# Patient Record
Sex: Female | Born: 1975 | Race: Black or African American | Hispanic: No | Marital: Married | Smoking: Never smoker
Health system: Southern US, Community
[De-identification: ages and names within clinical notes are randomized; demographics above are authoritative.]

## PROBLEM LIST (undated history)

## (undated) DIAGNOSIS — Z21 Asymptomatic human immunodeficiency virus [HIV] infection status: Secondary | ICD-10-CM

## (undated) DIAGNOSIS — E669 Obesity, unspecified: Secondary | ICD-10-CM

## (undated) DIAGNOSIS — B20 Human immunodeficiency virus [HIV] disease: Secondary | ICD-10-CM

## (undated) HISTORY — PX: TOOTH EXTRACTION: SUR596

## (undated) HISTORY — PX: ABDOMINAL HYSTERECTOMY: SHX81

---

## 2010-06-18 ENCOUNTER — Other Ambulatory Visit (HOSPITAL_COMMUNITY)
Admission: RE | Admit: 2010-06-18 | Discharge: 2010-06-18 | Disposition: A | Discharge status: Home / Self Care | Payer: Self-pay | Source: Ambulatory Visit | Attending: Obstetrics and Gynecology | Admitting: Obstetrics and Gynecology

## 2010-06-18 DIAGNOSIS — Z01419 Encounter for gynecological examination (general) (routine) without abnormal findings: Secondary | ICD-10-CM | POA: Insufficient documentation

## 2010-06-18 DIAGNOSIS — Z113 Encounter for screening for infections with a predominantly sexual mode of transmission: Secondary | ICD-10-CM | POA: Insufficient documentation

## 2010-12-10 ENCOUNTER — Emergency Department (HOSPITAL_BASED_OUTPATIENT_CLINIC_OR_DEPARTMENT_OTHER)
Admission: EM | Admit: 2010-12-10 | Discharge: 2010-12-10 | Disposition: A | Discharge status: Home / Self Care | Payer: Self-pay | Attending: Emergency Medicine | Admitting: Emergency Medicine

## 2010-12-10 ENCOUNTER — Encounter: Payer: Self-pay | Admitting: *Deleted

## 2010-12-10 ENCOUNTER — Emergency Department (INDEPENDENT_AMBULATORY_CARE_PROVIDER_SITE_OTHER): Payer: Self-pay

## 2010-12-10 ENCOUNTER — Telehealth (HOSPITAL_BASED_OUTPATIENT_CLINIC_OR_DEPARTMENT_OTHER): Payer: Self-pay | Admitting: Emergency Medicine

## 2010-12-10 DIAGNOSIS — O2390 Unspecified genitourinary tract infection in pregnancy, unspecified trimester: Secondary | ICD-10-CM | POA: Insufficient documentation

## 2010-12-10 DIAGNOSIS — N390 Urinary tract infection, site not specified: Secondary | ICD-10-CM | POA: Insufficient documentation

## 2010-12-10 DIAGNOSIS — O2692 Pregnancy related conditions, unspecified, second trimester: Secondary | ICD-10-CM

## 2010-12-10 DIAGNOSIS — R109 Unspecified abdominal pain: Secondary | ICD-10-CM

## 2010-12-10 DIAGNOSIS — Z21 Asymptomatic human immunodeficiency virus [HIV] infection status: Secondary | ICD-10-CM | POA: Insufficient documentation

## 2010-12-10 HISTORY — DX: Human immunodeficiency virus (HIV) disease: B20

## 2010-12-10 HISTORY — DX: Asymptomatic human immunodeficiency virus (hiv) infection status: Z21

## 2010-12-10 LAB — CBC
Hemoglobin: 12.2 g/dL (ref 12.0–15.0)
Platelets: 174 10*3/uL (ref 150–400)
RBC: 3.98 MIL/uL (ref 3.87–5.11)
WBC: 8.5 10*3/uL (ref 4.0–10.5)

## 2010-12-10 LAB — BASIC METABOLIC PANEL
GFR calc Af Amer: >90 mL/min (ref >90)
GFR calc non Af Amer: >90 mL/min (ref >90)
Glucose, Bld: 103 mg/dL — ABNORMAL HIGH (ref 70–99)
Potassium: 3.6 mEq/L (ref 3.5–5.1)
Sodium: 135 mEq/L (ref 135–145)

## 2010-12-10 LAB — URINALYSIS, ROUTINE W REFLEX MICROSCOPIC
Bilirubin Urine: NEGATIVE
Nitrite: NEGATIVE
Specific Gravity, Urine: 1.025 (ref 1.005–1.030)
pH: 5.5 (ref 5.0–8.0)

## 2010-12-10 LAB — HCG, QUANTITATIVE, PREGNANCY: hCG, Beta Chain, Quant, S: 18709 m[IU]/mL — ABNORMAL HIGH (ref <5)

## 2010-12-10 LAB — PREGNANCY, URINE: Preg Test, Ur: POSITIVE

## 2010-12-10 LAB — URINE MICROSCOPIC-ADD ON

## 2010-12-10 MED ORDER — PRENATAL RX 60-1 MG PO TABS: 1.0000 | ORAL_TABLET | Freq: Every day | ORAL | Status: DC

## 2010-12-10 MED ORDER — CEPHALEXIN 500 MG PO CAPS: 500.0000 mg | ORAL_CAPSULE | Freq: Four times a day (QID) | ORAL | Status: AC

## 2010-12-10 NOTE — ED Provider Notes (Signed)
History     CSN: [id number] Arrival date & time: 12/10/2010  9:22 AM   First MD Initiated Contact with Patient 12/10/10 0932      Chief Complaint  Patient presents with  . Abdominal Pain    (Consider location/radiation/quality/duration/timing/severity/associated sxs/prior treatment) Patient is a 35 y.o. female presenting with abdominal pain. The history is provided by the patient.  Abdominal Pain The primary symptoms of the illness include abdominal pain. The primary symptoms of the illness do not include shortness of breath, nausea, vomiting, diarrhea, hematemesis, hematochezia, dysuria, vaginal discharge or vaginal bleeding. The current episode started more than 2 days ago (Onset of right lower corner abdominal pain starting one month ago but worse in the past 2-3 days). The onset of the illness was gradual. The problem has been gradually worsening.  The abdominal pain is located in the RLQ. The abdominal pain does not radiate. The severity of the abdominal pain is 6/10. The abdominal pain is relieved by nothing.  The patient states that she believes she is currently pregnant (Patient's last menstrual period was June 28. She has not had a confirmatory pregnancy test 4 followup.). The patient has not had a change in bowel habit. Symptoms associated with the illness do not include chills, anorexia, diaphoresis, heartburn, constipation, urgency, hematuria, frequency or back pain.   Patient is a gravida 8 para 2 last measured. A June 28 she has not had a pregnancy test or any OB/GYN followup. Patient does believe that she is pregnant. She has had multiple miscarriages in the past.   Past Medical History  Diagnosis Date  . HIV (human immunodeficiency virus infection)     History reviewed. No pertinent past surgical history.  No family history on file.  History  Substance Use Topics  . Smoking status: Former Games developer  . Smokeless tobacco: Not on file  . Alcohol Use: No    OB History      Grav Para Term Preterm Abortions TAB SAB Ect Mult Living                  Review of Systems  Constitutional: Negative for chills and diaphoresis.  HENT: Negative for congestion and neck pain.   Eyes: Negative for redness and visual disturbance.  Respiratory: Negative for cough and shortness of breath.   Cardiovascular: Negative for chest pain.  Gastrointestinal: Positive for abdominal pain. Negative for heartburn, nausea, vomiting, diarrhea, constipation, hematochezia, anorexia and hematemesis.  Genitourinary: Positive for pelvic pain. Negative for dysuria, urgency, frequency, hematuria, vaginal bleeding and vaginal discharge.  Musculoskeletal: Negative for back pain.  Skin: Negative for rash.  Neurological: Negative for headaches.  Hematological: Does not bruise/bleed easily.    Allergies  Bactrim; Vancomycin; and Ciprofloxacin hcl  Home Medications   Current Outpatient Rx  Name Route Sig Dispense Refill  . EMTRICITABINE-TENOFOVIR 200-300 MG PO TABS Oral Take 1 tablet by mouth daily.      Marland Kitchen KALETRA PO Oral Take by mouth.      Marland Kitchen VALTREX PO Oral Take by mouth.      . CEPHALEXIN 500 MG PO CAPS Oral Take 1 capsule (500 mg total) by mouth 4 (four) times daily. 28 capsule 0  . PRENATAL RX 60-1 MG PO TABS Oral Take 1 tablet by mouth daily. 30 tablet 2    BP 137/64  Pulse 105  Temp(Src) 98.3 F (36.8 C) (Oral)  Resp 20  SpO2 100%  LMP 08/06/2010  Physical Exam  Nursing note and vitals reviewed. Constitutional: She  is oriented to person, place, and time. She appears well-developed and well-nourished. No distress.  HENT:  Head: Normocephalic and atraumatic.  Mouth/Throat: Oropharynx is clear and moist.  Eyes: Conjunctivae and EOM are normal. Pupils are equal, round, and reactive to light.  Neck: Normal range of motion. Neck supple.  Cardiovascular: Normal rate, regular rhythm, normal heart sounds and intact distal pulses.   No murmur heard. Pulmonary/Chest: Effort normal  and breath sounds normal. She has no wheezes.  Abdominal: Soft. Bowel sounds are normal. She exhibits no mass. There is no tenderness.  Musculoskeletal: Normal range of motion. She exhibits no edema and no tenderness.  Neurological: She is alert and oriented to person, place, and time. No cranial nerve deficit. She exhibits normal muscle tone. Coordination normal.  Skin: Skin is warm. No rash noted.    ED Course  Procedures (including critical care time)  Labs Reviewed  URINALYSIS, ROUTINE W REFLEX MICROSCOPIC - Abnormal; Notable for the following:    Color, Urine AMBER (*) BIOCHEMICALS MAY BE AFFECTED BY COLOR   Appearance CLOUDY (*)    Leukocytes, UA MODERATE (*)    All other components within normal limits  BASIC METABOLIC PANEL - Abnormal; Notable for the following:    Glucose, Bld 103 (*)    All other components within normal limits  HCG, QUANTITATIVE, PREGNANCY - Abnormal; Notable for the following:    hCG, Beta Chain, Quant, S 16109 (*)    All other components within normal limits  URINE MICROSCOPIC-ADD ON - Abnormal; Notable for the following:    Squamous Epithelial / LPF MANY (*)    Bacteria, UA MANY (*)    All other components within normal limits  PREGNANCY, URINE  CBC  URINE CULTURE   US Ob Limited  12/10/2010  *RADIOLOGY REPORT*  Clinical Data: [redacted] weeks EGA with bilateral groin discomfort for 1 month  LIMITED OBSTETRIC ULTRASOUND  Number of Fetuses: 1 Heart Rate: 150bpm Movement: Seen Presentation: Breech Placental Location: Anterior Previa: No Amniotic Fluid (Subjective): Normal, vertical pocket 5.1 cm  BPD: 3.83cm   17w   5d  MATERNAL FINDINGS: Cervix: 3.8 cm on transabdominal exam. Normal transabdominal appearance./ Uterus/Adnexae: Ovaries not seen. No definite adnexal masses.  IMPRESSION: Single living intrauterine gestation demonstrating an EGA by BPD alone of 17w 5d. This correlates well with expected EGA by LMP of 18w 0d and corresponding EDC of 05/13/11.  No focal  placental abnormality is noted.  Normal cervical length and appearance and subjectively and quantitatively normal amniotic fluid volume.  Ovaries not seen.  Recommend followup with non-emergent complete OB 14+ wk US examination for fetal biometric evaluation and anatomic survey. This could be performed at the [facility] [place].  Original Report Authenticated By: Bertha Stakes, M.D.   Results for orders placed during the hospital encounter of 12/10/10  URINALYSIS, ROUTINE W REFLEX MICROSCOPIC      Component Value Range   Color, Urine AMBER (*) YELLOW    Appearance CLOUDY (*) CLEAR    Specific Gravity, Urine 1.025  1.005 - 1.030    pH 5.5  5.0 - 8.0    Glucose, UA NEGATIVE  NEGATIVE (mg/dL)   Hgb urine dipstick NEGATIVE  NEGATIVE    Bilirubin Urine NEGATIVE  NEGATIVE    Ketones, ur NEGATIVE  NEGATIVE (mg/dL)   Protein, ur NEGATIVE  NEGATIVE (mg/dL)   Urobilinogen, UA 0.2  0.0 - 1.0 (mg/dL)   Nitrite NEGATIVE  NEGATIVE    Leukocytes, UA MODERATE (*) NEGATIVE  PREGNANCY, URINE      Component Value Range   Preg Test, Ur POSITIVE    BASIC METABOLIC PANEL      Component Value Range   Sodium 135  135 - 145 (mEq/L)   Potassium 3.6  3.5 - 5.1 (mEq/L)   Chloride 100  96 - 112 (mEq/L)   CO2 24  19 - 32 (mEq/L)   Glucose, Bld 103 (*) 70 - 99 (mg/dL)   BUN 6  6 - 23 (mg/dL)   Creatinine, Ser 1.61  0.50 - 1.10 (mg/dL)   Calcium 9.6  8.4 - 09.6 (mg/dL)   GFR calc non Af Amer >90  >90 (mL/min)   GFR calc Af Amer >90  >90 (mL/min)  CBC      Component Value Range   WBC 8.5  4.0 - 10.5 (K/uL)   RBC 3.98  3.87 - 5.11 (MIL/uL)   Hemoglobin 12.2  12.0 - 15.0 (g/dL)   HCT 04.5  40.9 - 81.1 (%)   MCV 90.7  78.0 - 100.0 (fL)   MCH 30.7  26.0 - 34.0 (pg)   MCHC 33.8  30.0 - 36.0 (g/dL)   RDW 91.4  78.2 - 95.6 (%)   Platelets 174  150 - 400 (K/uL)  HCG, QUANTITATIVE, PREGNANCY      Component Value Range   hCG, Beta Chain, Quant, S 18709 (*) <5 (mIU/mL)  URINE MICROSCOPIC-ADD ON        Component Value Range   Squamous Epithelial / LPF MANY (*) RARE    WBC, UA 7-10  <3 (WBC/hpf)   Bacteria, UA MANY (*) RARE    Urine-Other MUCOUS PRESENT       1. Pregnancy related condition in second trimester   2. Urinary tract infection       MDM   Patient with right lower quadrant abdominal pain for one month worse symptoms in the past 2-3 days. Ultrasound confirms intrauterine live pregnancy at 17 weeks and 5 days. A stone her last menstrual period she is approximately 18 weeks. Quantitative hCG 18,709. Findings consistent with a second term pregnancy. UA suggestive of perhaps an early urinary tract infection and since she's pregnant will treat as a UTI. Urine culture sent for confirmation. Doubt that the right lower quadrant pain is related to appendicitis based on labs and duration of the pain however patient given precautions in case symptoms worsen that this is a possibility. Certainly not for the past month with symptoms being worse in the past few days it remains a possibility. Patient has followup with an OB/GYN doctor in [place] area. Patient currently is not taking prenatal vitamins as we prescribed her. Currently no acute surgical abdomen.        Shelda Jakes, MD 12/10/10 1332

## 2010-12-10 NOTE — ED Notes (Signed)
Pregnant. Thinks she is 16 weeks. LMP June 28th. C.o bilateral lower groin pain. Most of the pain is on the right side.

## 2010-12-12 LAB — URINE CULTURE
Colony Count: 15000
Culture  Setup Time: 201211020248

## 2011-02-08 ENCOUNTER — Other Ambulatory Visit (HOSPITAL_COMMUNITY): Payer: Self-pay | Admitting: Obstetrics and Gynecology

## 2011-02-26 ENCOUNTER — Ambulatory Visit (HOSPITAL_COMMUNITY)
Admission: RE | Admit: 2011-02-26 | Discharge: 2011-02-26 | Disposition: A | Discharge status: Home / Self Care | Payer: PRIVATE HEALTH INSURANCE | Source: Ambulatory Visit | Attending: Obstetrics and Gynecology | Admitting: Obstetrics and Gynecology

## 2011-02-26 ENCOUNTER — Other Ambulatory Visit: Payer: Self-pay | Admitting: Maternal and Fetal Medicine

## 2011-02-26 DIAGNOSIS — O09529 Supervision of elderly multigravida, unspecified trimester: Secondary | ICD-10-CM | POA: Insufficient documentation

## 2011-02-26 DIAGNOSIS — IMO0002 Reserved for concepts with insufficient information to code with codable children: Secondary | ICD-10-CM | POA: Insufficient documentation

## 2011-02-26 DIAGNOSIS — O358XX0 Maternal care for other (suspected) fetal abnormality and damage, not applicable or unspecified: Secondary | ICD-10-CM | POA: Insufficient documentation

## 2011-03-05 NOTE — Progress Notes (Signed)
Genetic Counseling  High-Risk Gestation Note  Appointment Date:  02/26/2011 Referred By: Pricilla Holm, MD Date of Birth:  [birth year] Partner:  Betsey Holiday Attending: Particia Nearing, MD   Ms. Carrie Dominguez and her partner, Mr. Betsey Holiday, were seen for genetic counseling because of a maternal age of 36 y.o. at delivery.    They were counseled regarding maternal age and the association with risk for chromosome conditions due to nondisjunction with aging of the ova.   We reviewed chromosomes, nondisjunction, and the associated 1 in 179 risk for fetal aneuploidy related to a maternal age of 36 y.o. at delivery.  They were counseled that the risk for aneuploidy decreases as gestational age increases, accounting for those pregnancies which spontaneously abort.  We specifically discussed Down syndrome (trisomy 54), trisomies 49 and 41, and sex chromosome aneuploidies (47,XXX and 47,XXY) including the common features and prognoses of each.   We reviewed available screening and diagnostic options.  Regarding screening tests, we discussed the options of ultrasound and noninvasive prenatal testing (NIPT), which assesses cell free fetal DNA found in maternal circulation.  They understand that screening tests are used to modify a patient's a priori risk for aneuploidy, typically based on age.  This estimate provides a pregnancy specific risk assessment.  NIPT, cell free fetal DNA testing is not diagnostic for chromosome conditions, but can provide information regarding the presence or absence of extra fetal DNA for chromosomes 13, 18 and 21. Thus, it would not identify or rule out all aneuploidy. The reported detection rate is greater than 99% for Trisomy 21, greater than 97% for Trisomy 18, and is approximately 80% (8 out of 10) for Trisomy 13. The false positive rate is thought to be less than 1% for any of these conditions.   We also reviewed the availability of diagnostic options including  amniocentesis.  We discussed the risks, limitations, and benefits.  After reviewing these options, Carrie Dominguez elected to have ultrasound and maternal cell free fetal DNA testing ([facility]), but declined amniocentesis.   She understands that ultrasound and NIPT cannot rule out all birth defects or genetic syndromes. The patient was advised of this limitation and states she still does not want diagnostic testing at this time.  However, they were counseled that 50-80% of fetuses with Down syndrome and up to 90% of fetuses with trisomies 13 and 18, when well visualized, have detectable anomalies or soft markers by ultrasound. Complete ultrasound performed today. Ultrasound results reported separately.  Cell free fetal DNA testing ([facility]) results will be available in 8-10 days and will be forwarded to her OB office when we receive them.  Ms. Carrie Dominguez was provided with written information regarding sickle cell anemia (SCA) including the carrier frequency and incidence in the African-American population, the availability of carrier testing and prenatal diagnosis if indicated.  In addition, we discussed that hemoglobinopathies are routinely screened for as part of the  newborn screening panel.  She previously had a normal hemoglobin electrophoresis, indicating that she does not have sickle cell trait.  Both family histories were reviewed and found to be contributory for the patient's maternal first cousin having a daughter who died due to "trisomy." Carrie Dominguez did not have information regarding the specific trisomy that was present in this relative but reported that she had multiple birth defects and died at one 85 of age. We discussed that this description may fit with trisomy 32 or trisomy 54, which typically occur sporadically and would not increase recurrence risk  for relatives. However, accurate recurrence risk assessment cannot be performed with additional information regarding this relative's  karyotype or other medical records to confirm the condition present. Additional information may alter recurrence risk assessment.   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 The patient also reported that her sister has a history of 5 miscarriages, in addition to her two children. An underlying cause is not known. There can be many different causes for pregnancy losses.  When a person has experienced more than three losses, we typically begin to search for specific factors causing the miscarriages.  We reviewed several of these causes, including chromosome rearrangements, clotting factors, antibodies, and structural differences in the uterus.  Please contact us if the patient becomes interested in pursuing studies in attempt to determine a cause for their miscarriages. Without further information regarding the provided family history, an accurate genetic risk cannot be calculated. Further genetic counseling is warranted if more information is obtained.   Carrie Dominguez denied exposure to environmental toxins. She denied the use of alcohol, tobacco or street drugs. She denied significant viral illnesses during the course of her pregnancy. 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 She is currently treated with Kaletra, Truvada, and Valtrex and is followed by Infectious Disease physician, Dr. Hyacinth Meeker at [facility]. Carrie Dominguez was also seen for MFM consultation at the time of today's visit to further discuss pregnancy management regarding her history. See separate MFM consult note for detailed discussion.   I counseled this couple regarding the above risks and available options.  The approximate face-to-face time with the genetic counselor was 30 minutes.  Quinn Plowman, MS,  Certified Genetic Counselor 03/05/2011

## 2011-03-09 ENCOUNTER — Telehealth (HOSPITAL_COMMUNITY): Payer: Self-pay | Admitting: MS"

## 2011-03-09 ENCOUNTER — Other Ambulatory Visit: Payer: Self-pay | Admitting: Obstetrics & Gynecology

## 2011-03-09 NOTE — Telephone Encounter (Signed)
Left message for patient to return call.

## 2011-03-10 NOTE — Telephone Encounter (Signed)
Called Carrie Dominguez to discuss her [facility], cell free fetal DNA testing.  We reviewed that these are within normal limits, showing a less than 1 in 10,000 risk for trisomies 21, 18 and 13.  We reviewed that this testing identifies > 99% of pregnancies with trisomy 21, >97% of pregnancies with trisomy 10, and >80% with trisomy 45; the false positive rate is <0.1% for all conditions.  She understands that this testing does not identify all genetic conditions.  All questions were answered to her satisfaction, she was encouraged to call with additional questions or concerns.  Quinn Plowman, MS Patent attorney

## 2011-04-07 ENCOUNTER — Encounter: Payer: PRIVATE HEALTH INSURANCE | Attending: Obstetrics and Gynecology | Admitting: *Deleted

## 2011-04-07 DIAGNOSIS — Z713 Dietary counseling and surveillance: Secondary | ICD-10-CM | POA: Insufficient documentation

## 2011-04-07 DIAGNOSIS — O99810 Abnormal glucose complicating pregnancy: Secondary | ICD-10-CM | POA: Insufficient documentation

## 2011-04-08 ENCOUNTER — Encounter: Payer: Self-pay | Admitting: *Deleted

## 2011-04-08 NOTE — Progress Notes (Signed)
  Patient was seen on 04/07/2011 for Gestational Diabetes self-management class at the [facility]. The following learning objectives were met by the patient during this course:   States the definition of Gestational Diabetes  States why dietary management is important in controlling blood glucose  Describes the effects each nutrient has on blood glucose levels  Demonstrates ability to create a balanced meal plan  Demonstrates carbohydrate counting   States when to check blood glucose levels  Demonstrates proper blood glucose monitoring techniques  States the effect of stress and exercise on blood glucose levels  States the importance of limiting caffeine and abstaining from alcohol and smoking  Blood glucose monitor given:  One Touch Ultra Mini Self Monitoring Kit  Lot # [id number] x Exp: 12/2011 Blood glucose reading: 83 mg/dl  Patient instructed to monitor glucose levels: FBS: 60 - <90 2 hour: <120  *Patient received handouts:  Nutrition Diabetes and Pregnancy  Carbohydrate Counting List  Patient will be seen for follow-up as needed.

## 2011-04-08 NOTE — Patient Instructions (Signed)
Goals:  Check glucose levels per MD as instructed  Follow Gestational Diabetes Diet as instructed  Call for follow-up as needed    

## 2011-05-05 ENCOUNTER — Emergency Department (INDEPENDENT_AMBULATORY_CARE_PROVIDER_SITE_OTHER): Payer: PRIVATE HEALTH INSURANCE

## 2011-05-05 ENCOUNTER — Emergency Department (HOSPITAL_BASED_OUTPATIENT_CLINIC_OR_DEPARTMENT_OTHER)
Admission: EM | Admit: 2011-05-05 | Discharge: 2011-05-05 | Disposition: A | Discharge status: Other Acute Inpatient Hospital | Payer: PRIVATE HEALTH INSURANCE | Attending: Emergency Medicine | Admitting: Emergency Medicine

## 2011-05-05 ENCOUNTER — Encounter (HOSPITAL_BASED_OUTPATIENT_CLINIC_OR_DEPARTMENT_OTHER): Payer: Self-pay

## 2011-05-05 DIAGNOSIS — R109 Unspecified abdominal pain: Secondary | ICD-10-CM

## 2011-05-05 DIAGNOSIS — R1031 Right lower quadrant pain: Secondary | ICD-10-CM | POA: Insufficient documentation

## 2011-05-05 DIAGNOSIS — Z21 Asymptomatic human immunodeficiency virus [HIV] infection status: Secondary | ICD-10-CM | POA: Insufficient documentation

## 2011-05-05 DIAGNOSIS — G8918 Other acute postprocedural pain: Secondary | ICD-10-CM | POA: Insufficient documentation

## 2011-05-05 DIAGNOSIS — Z79899 Other long term (current) drug therapy: Secondary | ICD-10-CM | POA: Insufficient documentation

## 2011-05-05 DIAGNOSIS — N898 Other specified noninflammatory disorders of vagina: Secondary | ICD-10-CM | POA: Insufficient documentation

## 2011-05-05 DIAGNOSIS — N852 Hypertrophy of uterus: Secondary | ICD-10-CM

## 2011-05-05 LAB — DIFFERENTIAL
Basophils Relative: 0 % (ref 0–1)
Eosinophils Absolute: 0 10*3/uL (ref 0.0–0.7)
Monocytes Relative: 8 % (ref 3–12)
Neutrophils Relative %: 62 % (ref 43–77)

## 2011-05-05 LAB — CBC
Hemoglobin: 13.1 g/dL (ref 12.0–15.0)
MCH: 30.3 pg (ref 26.0–34.0)
MCHC: 33.6 g/dL (ref 30.0–36.0)
Platelets: 258 10*3/uL (ref 150–400)
RDW: 14.9 % (ref 11.5–15.5)

## 2011-05-05 LAB — URINE MICROSCOPIC-ADD ON

## 2011-05-05 LAB — URINALYSIS, ROUTINE W REFLEX MICROSCOPIC
Bilirubin Urine: NEGATIVE
Ketones, ur: NEGATIVE mg/dL
Nitrite: NEGATIVE
Specific Gravity, Urine: 1.015 (ref 1.005–1.030)
Urobilinogen, UA: 0.2 mg/dL (ref 0.0–1.0)

## 2011-05-05 LAB — BASIC METABOLIC PANEL
BUN: 12 mg/dL (ref 6–23)
GFR calc Af Amer: >90 mL/min (ref >90)
GFR calc non Af Amer: >90 mL/min (ref >90)
Potassium: 3.9 mEq/L (ref 3.5–5.1)

## 2011-05-05 MED ORDER — ONDANSETRON HCL 4 MG/2ML IJ SOLN
4.0000 mg | Freq: Once | INTRAMUSCULAR | Status: AC
  Administered 2011-05-05: 4 mg via INTRAVENOUS
  Filled 2011-05-05: qty 2

## 2011-05-05 MED ORDER — MORPHINE SULFATE 4 MG/ML IJ SOLN
4.0000 mg | Freq: Once | INTRAMUSCULAR | Status: AC
  Administered 2011-05-05: 4 mg via INTRAVENOUS
  Filled 2011-05-05: qty 1

## 2011-05-05 MED ORDER — MORPHINE SULFATE 4 MG/ML IJ SOLN
4.0000 mg | INTRAMUSCULAR | Status: DC | PRN
  Administered 2011-05-05 (×2): 4 mg via INTRAVENOUS
  Filled 2011-05-05 (×2): qty 1

## 2011-05-05 MED ORDER — HYDROMORPHONE HCL PF 1 MG/ML IJ SOLN
1.0000 mg | Freq: Once | INTRAMUSCULAR | Status: AC
  Administered 2011-05-05: 1 mg via INTRAVENOUS
  Filled 2011-05-05: qty 1

## 2011-05-05 MED ORDER — IOHEXOL 300 MG/ML  SOLN: 100.0000 mL | Freq: Once | INTRAMUSCULAR | Status: DC | PRN

## 2011-05-05 MED ORDER — IOHEXOL 300 MG/ML  SOLN: 20.0000 mL | INTRAMUSCULAR | Status: AC

## 2011-05-05 NOTE — ED Notes (Signed)
Pt c/o Lower R abdominal pain.  Pt states she delivered child on Thursday, had tubal on Friday but has had constant abdominal pain since.  Pt states pain increases when bearing down to have BM. Last BM this am around 0300 described as somewhat hard/constipated.  Pt taking Tylenol for discomfort with some relief.

## 2011-05-05 NOTE — ED Notes (Signed)
Report given to Langston Masker at [facility] ED. [facility] en route for transport to

## 2011-05-05 NOTE — ED Notes (Signed)
Pt tx to CT.

## 2011-05-05 NOTE — ED Notes (Signed)
[facility] arrived for transport 

## 2011-05-05 NOTE — ED Notes (Signed)
IV remains intact at time of transfer. Charted "removed" for documentation purposes only.

## 2011-05-05 NOTE — ED Notes (Addendum)
Pt transferred over to me by Dr. Bebe Shaggy and pending CT.  CT DOES not show any acute appendicitis or other acute pathology at this time.  It does show endometrial hematoma.  It would be expected the patient will have continued vaginal bleeding given that she is only 6 days postpartum.  I have put a page out to the [facility] GYN physician associated with [facility] where the patient delivered but have not heard back from Dr. Maudry Diego at this time.  Nat Christen, MD 05/05/11 1906  Pt discussed with Dr. Gavin Potters from [facility] ob/gyn.  Will transfer pt to address her continued abd pain and possible retained POC.  Pt to go to [facility] ED.  Will contact ED physician there.    Nat Christen, MD 05/05/11 [phone]

## 2011-05-05 NOTE — ED Provider Notes (Signed)
History     CSN: [id number]  Arrival date & time 05/05/11  1259   First MD Initiated Contact with Patient 05/05/11 1316      Chief Complaint  Patient presents with  . Abdominal Pain    Patient is a 36 y.o. female presenting with abdominal pain. The history is provided by the patient.  Abdominal Pain The primary symptoms of the illness include abdominal pain and vaginal bleeding. The primary symptoms of the illness do not include fever, fatigue, shortness of breath, vomiting, diarrhea or dysuria. The current episode started more than 2 days ago. The onset of the illness was gradual. The problem has been gradually worsening.  Risk factors for an acute abdominal problem include immunodeficiency. Symptoms associated with the illness do not include back pain. Significant associated medical issues include HIV.  Pt reports she had vaginal delivery (no complications) about 6 days ago. She then had tubal ligations while in hospital She reports persistent worsening RLQ abd pain Reports some vaginal bleeding She did have some constipation but this has improved and had normal BM yesterday but still having pain She has h/o HIV, she is taking and reports the virus is "undetectable"  Past Medical History  Diagnosis Date  . HIV (human immunodeficiency virus infection)     Past Surgical History  Procedure Date  . Tooth extraction     No family history on file.  History  Substance Use Topics  . Smoking status: Never Smoker   . Smokeless tobacco: Not on file  . Alcohol Use: No    OB History    Grav Para Term Preterm Abortions TAB SAB Ect Mult Living   1               Review of Systems  Constitutional: Negative for fever and fatigue.  Respiratory: Negative for shortness of breath.   Gastrointestinal: Positive for abdominal pain. Negative for vomiting and diarrhea.  Genitourinary: Positive for vaginal bleeding. Negative for dysuria.  Musculoskeletal: Negative for back pain.  All  other systems reviewed and are negative.    Allergies  Bactrim; Vancomycin; and Ciprofloxacin hcl  Home Medications   Current Outpatient Rx  Name Route Sig Dispense Refill  . EMTRICITABINE-TENOFOVIR 200-300 MG PO TABS Oral Take 1 tablet by mouth daily.      Marland Kitchen KALETRA PO Oral Take by mouth.      Marland Kitchen PRENATAL RX 60-1 MG PO TABS Oral Take 1 tablet by mouth daily. 30 tablet 2  . VALTREX PO Oral Take by mouth.        BP 145/97  Pulse 102  Temp(Src) 98 F (36.7 C) (Oral)  Resp 20  SpO2 100%  LMP 08/06/2010  Breastfeeding? Unknown  Physical Exam CONSTITUTIONAL: Well developed/well nourished HEAD AND FACE: Normocephalic/atraumatic EYES: EOMI/PERRL ENMT: Mucous membranes moist NECK: supple no meningeal signs SPINE:entire spine nontender CV: S1/S2 noted, no murmurs/rubs/gallops noted LUNGS: Lungs are clear to auscultation bilaterally, no apparent distress ABDOMEN: soft,RLQ tenderness, no rebound or guarding.  Well healing incision noted at umbilicus.  No drainage noted GU:no cva tenderness, blood noted in vaginal vault, exquisite tenderness to right adnexa.   NEURO: Pt is awake/alert, moves all extremitiesx4 EXTREMITIES: pulses normal, full ROM SKIN: warm, color normal PSYCH: no abnormalities of mood noted  ED Course  Procedures   Labs Reviewed  URINALYSIS, ROUTINE W REFLEX MICROSCOPIC - Abnormal; Notable for the following:    Hgb urine dipstick LARGE (*)    Leukocytes, UA SMALL (*)  All other components within normal limits  URINE MICROSCOPIC-ADD ON - Abnormal; Notable for the following:    Bacteria, UA MANY (*)    All other components within normal limits  CBC  DIFFERENTIAL  BASIC METABOLIC PANEL   1:61 PM Call into her OBGYN at [facility]  Will proceed with pelvic exam Pt is not breastfeeding 3:06 PM D/w dr Rachel Bo at [facility] maternal fetal medicine She request CT abd/pelvis as first imaging modality The MFM physician can be contacted at [phone] for any further  questions or need for transfer 3:12 PM Plan is to obtain CT imaging and reassess D/w dr hosmer to f/u on ct imaging Pt may require transfer to [facility] after initial imaging   MDM  Nursing notes reviewed and considered in documentation All labs/vitals reviewed and considered         Joya Gaskins, MD 05/05/11 1514

## 2011-12-04 ENCOUNTER — Emergency Department (HOSPITAL_BASED_OUTPATIENT_CLINIC_OR_DEPARTMENT_OTHER): Payer: Self-pay

## 2011-12-04 ENCOUNTER — Emergency Department (HOSPITAL_BASED_OUTPATIENT_CLINIC_OR_DEPARTMENT_OTHER)
Admission: EM | Admit: 2011-12-04 | Discharge: 2011-12-04 | Disposition: A | Discharge status: Home / Self Care | Payer: Self-pay | Attending: Emergency Medicine | Admitting: Emergency Medicine

## 2011-12-04 ENCOUNTER — Encounter (HOSPITAL_BASED_OUTPATIENT_CLINIC_OR_DEPARTMENT_OTHER): Payer: Self-pay | Admitting: Emergency Medicine

## 2011-12-04 DIAGNOSIS — R109 Unspecified abdominal pain: Secondary | ICD-10-CM | POA: Insufficient documentation

## 2011-12-04 DIAGNOSIS — E669 Obesity, unspecified: Secondary | ICD-10-CM | POA: Insufficient documentation

## 2011-12-04 DIAGNOSIS — Z79899 Other long term (current) drug therapy: Secondary | ICD-10-CM | POA: Insufficient documentation

## 2011-12-04 DIAGNOSIS — R10A0 Flank pain, unspecified side: Secondary | ICD-10-CM

## 2011-12-04 DIAGNOSIS — Z21 Asymptomatic human immunodeficiency virus [HIV] infection status: Secondary | ICD-10-CM | POA: Insufficient documentation

## 2011-12-04 HISTORY — DX: Obesity, unspecified: E66.9

## 2011-12-04 LAB — CBC WITH DIFFERENTIAL/PLATELET
Basophils Absolute: 0 10*3/uL (ref 0.0–0.1)
Basophils Relative: 0 % (ref 0–1)
Eosinophils Relative: 1 % (ref 0–5)
Hemoglobin: 12.4 g/dL (ref 12.0–15.0)
MCH: 30.9 pg (ref 26.0–34.0)
Monocytes Absolute: 0.7 10*3/uL (ref 0.1–1.0)
Neutrophils Relative %: 56 % (ref 43–77)
RBC: 4.01 MIL/uL (ref 3.87–5.11)

## 2011-12-04 LAB — URINALYSIS, ROUTINE W REFLEX MICROSCOPIC
Glucose, UA: NEGATIVE mg/dL
Hgb urine dipstick: NEGATIVE
Specific Gravity, Urine: 1.025 (ref 1.005–1.030)
Urobilinogen, UA: 1 mg/dL (ref 0.0–1.0)

## 2011-12-04 LAB — BASIC METABOLIC PANEL
Calcium: 8.9 mg/dL (ref 8.4–10.5)
Creatinine, Ser: 0.8 mg/dL (ref 0.50–1.10)
GFR calc Af Amer: >90 mL/min (ref >90)
GFR calc non Af Amer: >90 mL/min (ref >90)
Sodium: 138 mEq/L (ref 135–145)

## 2011-12-04 LAB — URINE MICROSCOPIC-ADD ON

## 2011-12-04 MED ORDER — HYDROCODONE-ACETAMINOPHEN 5-500 MG PO TABS: 1.0000 | ORAL_TABLET | Freq: Four times a day (QID) | ORAL | Status: DC | PRN

## 2011-12-04 MED ORDER — KETOROLAC TROMETHAMINE 60 MG/2ML IM SOLN
60.0000 mg | Freq: Once | INTRAMUSCULAR | Status: AC
  Administered 2011-12-04: 60 mg via INTRAMUSCULAR
  Filled 2011-12-04: qty 2

## 2011-12-04 MED ORDER — OXYCODONE-ACETAMINOPHEN 5-325 MG PO TABS
1.0000 | ORAL_TABLET | Freq: Once | ORAL | Status: AC
  Administered 2011-12-04: 1 via ORAL
  Filled 2011-12-04 (×2): qty 1

## 2011-12-04 NOTE — ED Provider Notes (Signed)
History     CSN: [id number]  Arrival date & time 12/04/11  0244   First MD Initiated Contact with Patient 12/04/11 0302      Chief Complaint  Patient presents with  . Flank Pain    (Consider location/radiation/quality/duration/timing/severity/associated sxs/prior treatment) Patient is a 36 y.o. female presenting with flank pain. The history is provided by the patient. No language interpreter was used.  Flank Pain This is a new problem. The current episode started less than 1 hour ago. The problem occurs constantly. The problem has not changed since onset.Pertinent negatives include no chest pain, no abdominal pain, no headaches and no shortness of breath. Nothing aggravates the symptoms. Nothing relieves the symptoms. She has tried nothing for the symptoms. The treatment provided no relief.    Past Medical History  Diagnosis Date  . HIV (human immunodeficiency virus infection)   . Obesity     Past Surgical History  Procedure Date  . Tooth extraction     No family history on file.  History  Substance Use Topics  . Smoking status: Never Smoker   . Smokeless tobacco: Not on file  . Alcohol Use: No    OB History    Grav Para Term Preterm Abortions TAB SAB Ect Mult Living   1               Review of Systems  Respiratory: Negative for chest tightness and shortness of breath.   Cardiovascular: Negative for chest pain, palpitations and leg swelling.  Gastrointestinal: Negative for abdominal pain.  Genitourinary: Positive for flank pain. Negative for dysuria, urgency and pelvic pain.  Neurological: Negative for headaches.  All other systems reviewed and are negative.    Allergies  Bactrim; Latex; Vancomycin; and Ciprofloxacin hcl  Home Medications   Current Outpatient Rx  Name Route Sig Dispense Refill  . ACETAMINOPHEN 500 MG PO CHEW Oral Chew 1,000 mg by mouth every 8 (eight) hours as needed. For pain    . EMTRICITABINE-TENOFOVIR 200-300 MG PO TABS Oral Take 1  tablet by mouth daily.      . IBUPROFEN 800 MG PO TABS Oral Take 800 mg by mouth every 8 (eight) hours as needed. For pain    . KALETRA PO Oral Take 2 tablets by mouth 2 (two) times daily.     Marland Kitchen PRENATAL RX 60-1 MG PO TABS Oral Take 1 tablet by mouth daily.    Marland Kitchen VALTREX PO Oral Take 1 tablet by mouth daily.       BP 139/69  Pulse 99  Temp 98.8 F (37.1 C) (Oral)  Resp 20  Ht 5\' 3"  (1.6 m)  Wt 238 lb (107.956 kg)  BMI 42.16 kg/m2  SpO2 99%  LMP 11/06/2011  Breastfeeding? No  Physical Exam  Constitutional: She is oriented to person, place, and time. She appears well-developed and well-nourished. No distress.  HENT:  Head: Normocephalic and atraumatic.  Mouth/Throat: Oropharynx is clear and moist.  Eyes: Conjunctivae normal are normal. Pupils are equal, round, and reactive to light.  Neck: Normal range of motion. Neck supple.  Cardiovascular: Normal rate and regular rhythm.   Pulmonary/Chest: Effort normal and breath sounds normal. She has no wheezes. She has no rales.  Abdominal: Soft. Bowel sounds are normal. There is no tenderness. There is no rebound and no guarding.  Musculoskeletal: Normal range of motion.       Arms: Neurological: She is alert and oriented to person, place, and time.  Skin: Skin is warm and  dry.  Psychiatric: She has a normal mood and affect.    ED Course  Procedures (including critical care time)  Labs Reviewed  URINALYSIS, ROUTINE W REFLEX MICROSCOPIC - Abnormal; Notable for the following:    APPearance CLOUDY (*)     Leukocytes, UA SMALL (*)     All other components within normal limits  URINE MICROSCOPIC-ADD ON - Abnormal; Notable for the following:    Squamous Epithelial / LPF FEW (*)     Bacteria, UA FEW (*)     All other components within normal limits  PREGNANCY, URINE  CBC WITH DIFFERENTIAL  URINE CULTURE  BASIC METABOLIC PANEL   Ct Abdomen Pelvis Wo Contrast  12/04/2011  *RADIOLOGY REPORT*  Clinical Data: Right flank pain.  CT  ABDOMEN AND PELVIS WITHOUT CONTRAST  Technique:  Multidetector CT imaging of the abdomen and pelvis was performed following the standard protocol without intravenous contrast.  Comparison: 05/05/2011  Findings: The lung bases are clear.  The kidneys appear symmetrical in size and shape.  No pyelocaliectasis or ureterectasis.  No renal, ureteral, or bladder stones.  The bladder is decompressed, limiting evaluation for wall thickness.  The unenhanced appearance of the liver, spleen, contracted gallbladder, pancreas, adrenal glands, abdominal aorta, and retroperitoneal lymph nodes is unremarkable.  The stomach, small bowel, and colon are not abnormally distended.  Stool filled colon without wall thickening.  There is an umbilical hernia containing fat.  No free air or free fluid in the abdomen.  Pelvis:  The uterus and ovaries are not enlarged.  No free or loculated pelvic fluid collections.  No pelvic lymphadenopathy. Normal appendix.  No diverticulitis.  Normal alignment of the lumbar vertebrae.  IMPRESSION: No renal or ureteral stone or obstruction demonstrated.  Umbilical hernia.   Original Report Authenticated By: Marlon Pel, M.D.      No diagnosis found.    MDM  Flank pain likely muscle spasm.  Follow up with your family doctor for ongoing care       Kajol Crispen K Ricardo Kayes-Rasch, MD 12/04/11 [phone]

## 2011-12-04 NOTE — ED Notes (Signed)
Patient transported to CT 

## 2011-12-04 NOTE — ED Notes (Signed)
Pt awoke with right flank pain.

## 2011-12-05 LAB — URINE CULTURE

## 2012-02-09 HISTORY — PX: ABDOMINAL HYSTERECTOMY: SHX81

## 2012-12-01 DIAGNOSIS — F419 Anxiety disorder, unspecified: Secondary | ICD-10-CM | POA: Insufficient documentation

## 2012-12-01 DIAGNOSIS — B20 Human immunodeficiency virus [HIV] disease: Secondary | ICD-10-CM | POA: Insufficient documentation

## 2013-12-10 ENCOUNTER — Encounter (HOSPITAL_BASED_OUTPATIENT_CLINIC_OR_DEPARTMENT_OTHER): Payer: Self-pay | Admitting: Emergency Medicine

## 2015-01-01 DIAGNOSIS — Z90710 Acquired absence of both cervix and uterus: Secondary | ICD-10-CM | POA: Insufficient documentation

## 2015-04-28 ENCOUNTER — Encounter (HOSPITAL_BASED_OUTPATIENT_CLINIC_OR_DEPARTMENT_OTHER): Payer: Self-pay | Admitting: Emergency Medicine

## 2015-04-28 ENCOUNTER — Emergency Department (HOSPITAL_BASED_OUTPATIENT_CLINIC_OR_DEPARTMENT_OTHER): Payer: BLUE CROSS/BLUE SHIELD

## 2015-04-28 ENCOUNTER — Emergency Department (HOSPITAL_BASED_OUTPATIENT_CLINIC_OR_DEPARTMENT_OTHER)
Admission: EM | Admit: 2015-04-28 | Discharge: 2015-04-29 | Disposition: A | Discharge status: Home / Self Care | Payer: BLUE CROSS/BLUE SHIELD | Attending: Emergency Medicine | Admitting: Emergency Medicine

## 2015-04-28 DIAGNOSIS — Z79899 Other long term (current) drug therapy: Secondary | ICD-10-CM | POA: Diagnosis not present

## 2015-04-28 DIAGNOSIS — E669 Obesity, unspecified: Secondary | ICD-10-CM | POA: Diagnosis not present

## 2015-04-28 DIAGNOSIS — J209 Acute bronchitis, unspecified: Secondary | ICD-10-CM | POA: Insufficient documentation

## 2015-04-28 DIAGNOSIS — B20 Human immunodeficiency virus [HIV] disease: Secondary | ICD-10-CM | POA: Insufficient documentation

## 2015-04-28 DIAGNOSIS — Z91040 Latex allergy status: Secondary | ICD-10-CM | POA: Diagnosis not present

## 2015-04-28 DIAGNOSIS — R05 Cough: Secondary | ICD-10-CM | POA: Diagnosis present

## 2015-04-28 NOTE — ED Notes (Signed)
Patient transported to X-ray 

## 2015-04-28 NOTE — ED Notes (Signed)
Patient states that she is feeling horrible. She has cough with sinus congestion x 1 month Reports that she was concerned because her daughter has PNA

## 2015-04-28 NOTE — ED Notes (Signed)
Pt states she has had cough, sneezing, H/A at times. Daughter recently had pneumonia.

## 2015-04-29 MED ORDER — ALBUTEROL SULFATE HFA 108 (90 BASE) MCG/ACT IN AERS
2.0000 | INHALATION_SPRAY | Freq: Once | RESPIRATORY_TRACT | Status: AC
  Administered 2015-04-29: 2 via RESPIRATORY_TRACT
  Filled 2015-04-29: qty 6.7

## 2015-04-29 NOTE — Discharge Instructions (Signed)

## 2015-04-29 NOTE — ED Provider Notes (Signed)
CSN: [id number]     Arrival date & time 04/28/15  2251 History  By signing my name below, I, Budd PalmerVanessa Prueter, attest that this documentation has been prepared under the direction and in the presence of Paula LibraJohn Nygeria Lager, MD. Electronically Signed: Budd PalmerVanessa Prueter, ED Scribe. 04/29/2015. 12:30 AM.    Chief Complaint  Patient presents with  . Cough   The history is provided by the patient. No language interpreter was used.   HPI Comments: Carrie Dominguez is a 40 y.o. female with a PMHx of HIV (currently undetectable on HAART) who presents to the Emergency Department complaining of persistent dry cough onset 1 month ago. Pt states she has been feeling ill for the past month. She notes she did have fever, and n/v/d, all of which have since resolved. She reports continued associated SOB, rhinorrhea, nasal congestion, and sinus pressure. She thought she might have sinusitis, and has taken decongestants with moderate relief. She states her daughter was recently diagnosed with PNA after being sick with similar symptoms.   Past Medical History  Diagnosis Date  . HIV (human immunodeficiency virus infection) (HCC)   . Obesity    Past Surgical History  Procedure Laterality Date  . Tooth extraction    . Abdominal hysterectomy     History reviewed. No pertinent family history. Social History  Substance Use Topics  . Smoking status: Never Smoker   . Smokeless tobacco: None  . Alcohol Use: No   OB History    Gravida Para Term Preterm AB TAB SAB Ectopic Multiple Living   1              Review of Systems  All other systems reviewed and are negative.   Allergies  Bactrim; Latex; Vancomycin; and Ciprofloxacin hcl  Home Medications   Prior to Admission medications   Medication Sig Start Date End Date Taking? Authorizing Provider  elvitegravir-cobicistat-emtricitabine-tenofovir (STRIBILD) 150-150-200-300 MG TABS tablet Take 1 tablet by mouth daily with breakfast.   Yes Historical Provider, MD   ibuprofen (ADVIL,MOTRIN) 800 MG tablet Take 800 mg by mouth every 8 (eight) hours as needed. For pain    Historical Provider, MD  Prenatal Vit-Fe Fumarate-FA (PRENATAL MULTIVITAMIN) 60-1 MG tablet Take 1 tablet by mouth daily.    Vanetta MuldersScott Zackowski, MD  ValACYclovir HCl (VALTREX PO) Take 1 tablet by mouth daily.     Historical Provider, MD   BP 132/85 mmHg  Pulse 94  Temp(Src) 98.6 F (37 C) (Oral)  Resp 18  Wt 238 lb (107.956 kg)  SpO2 100%  LMP 11/06/2011 Physical Exam General: Well-developed, well-nourished female in no acute distress; appearance consistent with age of record HENT: normocephalic; atraumatic; no sinus TTP; pharynx normal Eyes: pupils equal, round and reactive to light; extraocular muscles intact Neck: supple Heart: regular rate and rhythm Lungs: decreased breath sounds bilaterally with coughing on deep breathing Abdomen: soft; nondistended; nontender; bowel sounds present Extremities: No deformity; full range of motion; pulses normal Neurologic: Awake, alert and oriented; motor function intact in all extremities and symmetric; no facial droop Skin: Warm and dry Psychiatric: Normal mood and affect   ED Course  Procedures   MDM  Nursing notes and vitals signs, including pulse oximetry, reviewed.  Summary of this visit's results, reviewed by myself:  Imaging Studies: Dg Chest 2 View  04/29/2015  CLINICAL DATA:  Chest pain and cough for 1 month. EXAM: CHEST  2 VIEW COMPARISON:  None. FINDINGS: The heart size and mediastinal contours are within normal limits. Both lungs  are clear. The visualized skeletal structures are unremarkable. IMPRESSION: No active cardiopulmonary disease. Electronically Signed   By: Burman Nieves M.D.   On: 04/29/2015 00:09    Final diagnoses:  Acute bronchitis with bronchospasm   I personally performed the services described in this documentation, which was scribed in my presence. The recorded information has been reviewed and is  accurate.   Paula Libra, MD 04/29/15 [phone]

## 2016-04-26 ENCOUNTER — Encounter (HOSPITAL_BASED_OUTPATIENT_CLINIC_OR_DEPARTMENT_OTHER): Payer: Self-pay | Admitting: *Deleted

## 2016-04-26 ENCOUNTER — Emergency Department (HOSPITAL_BASED_OUTPATIENT_CLINIC_OR_DEPARTMENT_OTHER)
Admission: EM | Admit: 2016-04-26 | Discharge: 2016-04-26 | Disposition: A | Discharge status: Home / Self Care | Payer: BLUE CROSS/BLUE SHIELD | Attending: Emergency Medicine | Admitting: Emergency Medicine

## 2016-04-26 DIAGNOSIS — Y999 Unspecified external cause status: Secondary | ICD-10-CM | POA: Insufficient documentation

## 2016-04-26 DIAGNOSIS — W500XXA Accidental hit or strike by another person, initial encounter: Secondary | ICD-10-CM | POA: Insufficient documentation

## 2016-04-26 DIAGNOSIS — Y92009 Unspecified place in unspecified non-institutional (private) residence as the place of occurrence of the external cause: Secondary | ICD-10-CM | POA: Insufficient documentation

## 2016-04-26 DIAGNOSIS — Y939 Activity, unspecified: Secondary | ICD-10-CM | POA: Insufficient documentation

## 2016-04-26 DIAGNOSIS — Z21 Asymptomatic human immunodeficiency virus [HIV] infection status: Secondary | ICD-10-CM | POA: Insufficient documentation

## 2016-04-26 DIAGNOSIS — H209 Unspecified iridocyclitis: Secondary | ICD-10-CM | POA: Insufficient documentation

## 2016-04-26 MED ORDER — PREDNISOLONE ACETATE 1 % OP SUSP
1.0000 [drp] | Freq: Once | OPHTHALMIC | Status: AC
  Administered 2016-04-26: 1 [drp] via OPHTHALMIC
  Filled 2016-04-26: qty 5

## 2016-04-26 MED ORDER — CYCLOPENTOLATE HCL 1 % OP SOLN
1.0000 [drp] | Freq: Once | OPHTHALMIC | Status: AC
  Administered 2016-04-26: 1 [drp] via OPHTHALMIC
  Filled 2016-04-26: qty 2

## 2016-04-26 MED ORDER — FLUORESCEIN SODIUM 0.6 MG OP STRP
ORAL_STRIP | OPHTHALMIC | Status: AC
  Filled 2016-04-26: qty 1

## 2016-04-26 MED ORDER — OXYCODONE-ACETAMINOPHEN 5-325 MG PO TABS: 1.0000 | ORAL_TABLET | Freq: Four times a day (QID) | ORAL | 0 refills | Status: DC | PRN

## 2016-04-26 MED ORDER — TETRACAINE HCL 0.5 % OP SOLN
OPHTHALMIC | Status: AC
  Filled 2016-04-26: qty 4

## 2016-04-26 MED ORDER — FLUORESCEIN SODIUM 0.6 MG OP STRP
1.0000 | ORAL_STRIP | Freq: Once | OPHTHALMIC | Status: AC
  Administered 2016-04-26: 1 via OPHTHALMIC

## 2016-04-26 MED ORDER — TETRACAINE HCL 0.5 % OP SOLN
2.0000 [drp] | Freq: Once | OPHTHALMIC | Status: AC
  Administered 2016-04-26: 2 [drp] via OPHTHALMIC
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
## 2016-04-26 NOTE — Discharge Instructions (Signed)
Please read and follow all provided instructions.  Your diagnoses today include:  1. Anterior uveitis     Tests performed today include:  Visual acuity testing to check your vision  Fluorescein dye examination to look for scratches on your eye  Tonometry to check the pressure inside of your eye  Vital signs. See below for your results today.   Medications prescribed:   Cyclopentolate - use 1 drop in right eye 3 times a day   Prednisolone - use 1 drop in right eye 4 times a day   Percocet (oxycodone/acetaminophen) - narcotic pain medication  DO NOT drive or perform any activities that require you to be awake and alert because this medicine can make you drowsy. BE VERY CAREFUL not to take multiple medicines containing Tylenol (also called acetaminophen). Doing so can lead to an overdose which can damage your liver and cause liver failure and possibly death.  Take any prescribed medications only as directed.  Home care instructions:  Follow any educational materials contained in this packet. If you wear contact lenses, do not use them until your eye caregiver approves. Follow-up care is necessary to be sure the infection is healing if not completely resolved in 2-3 days. See your caregiver or eye specialist as suggested for followup.   If you have an eye infection, wash your hands often as this is very contagious and is easily spread from person to person.   Follow-up instructions: Please follow-up with Dr. Genia DelMincey tomorrow. Call the office for an appointment.   Return instructions:   Please return to the Emergency Department if you experience worsening symptoms.   Please return immediately if you develop severe pain, pus drainage, new change in vision, or fever.  Please return if you have any other emergent concerns.  Additional Information:  Your vital signs today were: BP (!) 138/104 (BP Location: Left Arm)    Pulse 81    Temp 98.9 F (37.2 C) (Oral)    Resp 18    Ht  5\' 4"  (1.626 m)    Wt 105.2 kg    LMP 11/06/2011    SpO2 100%    BMI 39.82 kg/m  If your blood pressure (BP) was elevated above 135/85 this visit, please have this repeated by your doctor within one month. ---------------

## 2017-09-17 ENCOUNTER — Emergency Department (HOSPITAL_BASED_OUTPATIENT_CLINIC_OR_DEPARTMENT_OTHER)
Admission: EM | Admit: 2017-09-17 | Discharge: 2017-09-17 | Disposition: A | Discharge status: Home / Self Care | Payer: PRIVATE HEALTH INSURANCE | Attending: Emergency Medicine | Admitting: Emergency Medicine

## 2017-09-17 ENCOUNTER — Other Ambulatory Visit: Payer: Self-pay

## 2017-09-17 ENCOUNTER — Emergency Department (HOSPITAL_BASED_OUTPATIENT_CLINIC_OR_DEPARTMENT_OTHER): Payer: PRIVATE HEALTH INSURANCE

## 2017-09-17 ENCOUNTER — Encounter (HOSPITAL_BASED_OUTPATIENT_CLINIC_OR_DEPARTMENT_OTHER): Payer: Self-pay | Admitting: Emergency Medicine

## 2017-09-17 DIAGNOSIS — Y929 Unspecified place or not applicable: Secondary | ICD-10-CM | POA: Insufficient documentation

## 2017-09-17 DIAGNOSIS — S86911A Strain of unspecified muscle(s) and tendon(s) at lower leg level, right leg, initial encounter: Secondary | ICD-10-CM | POA: Insufficient documentation

## 2017-09-17 DIAGNOSIS — B20 Human immunodeficiency virus [HIV] disease: Secondary | ICD-10-CM | POA: Diagnosis not present

## 2017-09-17 DIAGNOSIS — Y33XXXA Other specified events, undetermined intent, initial encounter: Secondary | ICD-10-CM | POA: Insufficient documentation

## 2017-09-17 DIAGNOSIS — R0789 Other chest pain: Secondary | ICD-10-CM | POA: Diagnosis not present

## 2017-09-17 DIAGNOSIS — Y999 Unspecified external cause status: Secondary | ICD-10-CM | POA: Insufficient documentation

## 2017-09-17 DIAGNOSIS — Z79899 Other long term (current) drug therapy: Secondary | ICD-10-CM | POA: Diagnosis not present

## 2017-09-17 DIAGNOSIS — Y939 Activity, unspecified: Secondary | ICD-10-CM | POA: Diagnosis not present

## 2017-09-17 DIAGNOSIS — S8991XA Unspecified injury of right lower leg, initial encounter: Secondary | ICD-10-CM | POA: Diagnosis present

## 2017-09-17 LAB — URINALYSIS, ROUTINE W REFLEX MICROSCOPIC
BILIRUBIN URINE: NEGATIVE
GLUCOSE, UA: NEGATIVE mg/dL
HGB URINE DIPSTICK: NEGATIVE
Ketones, ur: NEGATIVE mg/dL
Leukocytes, UA: NEGATIVE
Nitrite: NEGATIVE
PROTEIN: NEGATIVE mg/dL
Specific Gravity, Urine: >1.03 — ABNORMAL HIGH (ref 1.005–1.030)
pH: 5.5 (ref 5.0–8.0)

## 2017-09-17 LAB — BASIC METABOLIC PANEL
Anion gap: 8 (ref 5–15)
BUN: 15 mg/dL (ref 6–20)
CALCIUM: 9.1 mg/dL (ref 8.9–10.3)
CO2: 25 mmol/L (ref 22–32)
CREATININE: 0.91 mg/dL (ref 0.44–1.00)
Chloride: 106 mmol/L (ref 98–111)
GFR calc non Af Amer: >60 mL/min (ref >60)
Glucose, Bld: 97 mg/dL (ref 70–99)
Potassium: 3.5 mmol/L (ref 3.5–5.1)
SODIUM: 139 mmol/L (ref 135–145)

## 2017-09-17 LAB — CBC
HCT: 38.6 % (ref 36.0–46.0)
HEMOGLOBIN: 12.9 g/dL (ref 12.0–15.0)
MCH: 32.3 pg (ref 26.0–34.0)
MCHC: 33.4 g/dL (ref 30.0–36.0)
MCV: 96.5 fL (ref 78.0–100.0)
Platelets: 156 10*3/uL (ref 150–400)
RBC: 4 MIL/uL (ref 3.87–5.11)
RDW: 12.7 % (ref 11.5–15.5)
WBC: 6.4 10*3/uL (ref 4.0–10.5)

## 2017-09-17 LAB — TROPONIN I

## 2017-09-17 LAB — D-DIMER, QUANTITATIVE (NOT AT ARMC): D DIMER QUANT: 0.29 ug{FEU}/mL (ref 0.00–0.50)

## 2017-09-17 MED ORDER — SODIUM CHLORIDE 0.9 % IV BOLUS
1000.0000 mL | Freq: Once | INTRAVENOUS | Status: AC
  Administered 2017-09-17: 1000 mL via INTRAVENOUS

## 2017-09-17 MED ORDER — CELECOXIB 200 MG PO CAPS: 200.0000 mg | ORAL_CAPSULE | Freq: Two times a day (BID) | ORAL | 0 refills | Status: DC

## 2017-09-17 MED ORDER — KETOROLAC TROMETHAMINE 30 MG/ML IJ SOLN
30.0000 mg | Freq: Once | INTRAMUSCULAR | Status: AC
  Administered 2017-09-17: 30 mg via INTRAVENOUS
  Filled 2017-09-17: qty 1

## 2017-09-17 NOTE — ED Provider Notes (Addendum)
[facility] EMERGENCY DEPARTMENT Provider Note   CSN: [id number] Arrival date & time: 09/17/17  1817     History   Chief Complaint Chief Complaint  Patient presents with  . Chest Pain  . Knee Pain    HPI Lyda KalataStephanie Cederberg is a 42 y.o. female.  Pt presents to the ED today with right sided cp, right knee pain.  Pt said she does not feel well.  She does have HIV, but has been compliant with her meds and her CD4 counts have been good.  She denies any fevers.  Pt denies cough or sob.     Past Medical History:  Diagnosis Date  . HIV (human immunodeficiency virus infection) (HCC)   . Obesity     Patient Active Problem List   Diagnosis Date Noted  . History of hysterectomy for benign disease 01/01/2015  . Human immunodeficiency virus (HIV) disease (HCC) 12/01/2012  . Anxiety 12/01/2012    Past Surgical History:  Procedure Laterality Date  . ABDOMINAL HYSTERECTOMY    . TOOTH EXTRACTION       OB History    Gravida  1   Para      Term      Preterm      AB      Living        SAB      TAB      Ectopic      Multiple      Live Births               Home Medications    Prior to Admission medications   Medication Sig Start Date End Date Taking? Authorizing Provider  celecoxib (CELEBREX) 200 MG capsule Take 1 capsule (200 mg total) by mouth 2 (two) times daily. 09/17/17   Jacalyn LefevreHaviland, Mansfield Dann, MD  elvitegravir-cobicistat-emtricitabine-tenofovir (STRIBILD) 150-150-200-300 MG TABS tablet Take 1 tablet by mouth daily with breakfast.    [provider]  ibuprofen (ADVIL,MOTRIN) 800 MG tablet Take 800 mg by mouth every 8 (eight) hours as needed. For pain    [provider]  Prenatal Vit-Fe Fumarate-FA (PRENATAL MULTIVITAMIN) 60-1 MG tablet Take 1 tablet by mouth daily.    Vanetta MuldersZackowski, Scott, MD  ValACYclovir HCl (VALTREX PO) Take 1 tablet by mouth daily.     [provider]    Family History No family history on file.  Social  History Social History   Tobacco Use  . Smoking status: Never Smoker  . Smokeless tobacco: Never Used  Substance Use Topics  . Alcohol use: No  . Drug use: No     Allergies   Sulfa antibiotics; Bactrim; Latex; Vancomycin; and Ciprofloxacin hcl   Review of Systems Review of Systems  Cardiovascular: Positive for chest pain.  Musculoskeletal:       Right knee pain  All other systems reviewed and are negative.    Physical Exam Updated Vital Signs BP 113/89 (BP Location: Left Arm)   Pulse 90   Temp 98.3 F (36.8 C) (Oral)   Resp (!) 24   Ht 5\' 4"  (1.626 m)   Wt 108.9 kg   LMP 11/06/2011   SpO2 100%   BMI 41.20 kg/m   Physical Exam  Constitutional: She is oriented to person, place, and time. She appears well-developed and well-nourished.  HENT:  Head: Normocephalic and atraumatic.  Eyes: Pupils are equal, round, and reactive to light. EOM are normal.  Neck: Normal range of motion. Neck supple.  Cardiovascular: Normal rate, regular  rhythm, intact distal pulses and normal pulses.  Pulmonary/Chest: Effort normal and breath sounds normal.  Abdominal: Soft. Bowel sounds are normal.  Musculoskeletal: Normal range of motion.       Right knee: She exhibits no swelling. Tenderness found.       Right lower leg: Normal.       Left lower leg: Normal.  Neurological: She is alert and oriented to person, place, and time.  Skin: Skin is warm. Capillary refill takes less than 2 seconds.  Psychiatric: She has a normal mood and affect. Her behavior is normal.  Nursing note and vitals reviewed.    ED Treatments / Results  Labs (all labs ordered are listed, but only abnormal results are displayed) Labs Reviewed  URINALYSIS, ROUTINE W REFLEX MICROSCOPIC - Abnormal; Notable for the following components:      Result Value   APPearance CLOUDY (*)    Specific Gravity, Urine >1.030 (*)    All other components within normal limits  BASIC METABOLIC PANEL  CBC  TROPONIN I  D-DIMER,  QUANTITATIVE (NOT AT [place])    EKG EKG Interpretation  Date/Time:  Saturday September 17 2017 18:32:19 EDT Ventricular Rate:  98 PR Interval:  178 QRS Duration: 82 QT Interval:  346 QTC Calculation: 441 R Axis:   42 Text Interpretation:  Normal sinus rhythm Septal infarct , age undetermined Abnormal ECG No old tracing to compare Confirmed by Pricilla Loveless [phone]) on 09/25/2017 4:04:47 PM  EKG not coming through on MUSE  NSR.  HR 98.  No st elevations.  T wave inversions inferior leads.  No old ekg to compare.  Radiology No results found.  Procedures Procedures (including critical care time)  Medications Ordered in ED Medications  sodium chloride 0.9 % bolus 1,000 mL (0 mLs Intravenous Stopped 09/17/17 2134)  ketorolac (TORADOL) 30 MG/ML injection 30 mg (30 mg Intravenous Given 09/17/17 2033)     Initial Impression / Assessment and Plan / ED Course  I have reviewed the triage vital signs and the nursing notes.  Pertinent labs & imaging results that were available during my care of the patient were reviewed by me and considered in my medical decision making (see chart for details).     Pt placed in a knee immobilizer.  Cp sx atypical.  She is instructed to return if worse and f/u with pcp.  Final Clinical Impressions(s) / ED Diagnoses   Final diagnoses:  Atypical chest pain  Knee strain, right, initial encounter    ED Discharge Orders         Ordered    celecoxib (CELEBREX) 200 MG capsule  2 times daily     09/17/17 2236           Jacalyn Lefevre, MD 09/17/17 1191    Jacalyn Lefevre, MD 10/15/17 [phone]

## 2017-09-17 NOTE — ED Triage Notes (Signed)
Chest pain radiating into her back x 2 weeks. R knee pain/stiffness x 1 week.

## 2017-09-20 ENCOUNTER — Ambulatory Visit (INDEPENDENT_AMBULATORY_CARE_PROVIDER_SITE_OTHER): Payer: PRIVATE HEALTH INSURANCE | Admitting: Family Medicine

## 2017-09-20 VITALS — BP 126/67 | HR 80 | Ht 64.0 in | Wt 250.0 lb

## 2017-09-20 DIAGNOSIS — M25561 Pain in right knee: Secondary | ICD-10-CM | POA: Diagnosis not present

## 2017-09-20 NOTE — Patient Instructions (Signed)
You have knee synovitis with an effusion (possible patellar tendinitis too but I'd expect more pain if this were the case). Icing 15 minutes at a time 3-4 times a day. Compression sleeve or ACE wrap when up and walking around. Elevate above your heart as much as possible. Celebrex twice a day with food. If not improving would consider cortisone injection - just let me know and we can do this (with or without draining the knee). Straight leg raises, knee extensions to keep motion and strength in this leg. Follow up with me in 1 month otherwise.

## 2017-09-21 ENCOUNTER — Encounter: Payer: Self-pay | Admitting: Family Medicine

## 2017-09-21 NOTE — Progress Notes (Signed)
PCP: Phillis KnackMiller, Rachel A, MA (Inactive)  Subjective:   HPI: Patient is a 42 y.o. female here for right knee pain.  Patient reports she's had 1 week of anterior right knee pain. Pain is currently 0 out of 10 but up to 10 out of 10 at times and sharp. She works 12-hour shifts at least 3 days a week and worse by the end of this. She states pain started in the morning and she just woke up with this.  No increase in activity level the prior day. No prior issues with the right knee. She is tried ibuprofen, Tylenol, BC powders. She just started Celebrex which helped last night but not so far today. No skin changes, numbness.  Past Medical History:  Diagnosis Date  . HIV (human immunodeficiency virus infection) (HCC)   . Obesity     Current Outpatient Medications on File Prior to Visit  Medication Sig Dispense Refill  . celecoxib (CELEBREX) 200 MG capsule Take 1 capsule (200 mg total) by mouth 2 (two) times daily. 60 capsule 0  . elvitegravir-cobicistat-emtricitabine-tenofovir (STRIBILD) 150-150-200-300 MG TABS tablet Take 1 tablet by mouth daily with breakfast.    . ibuprofen (ADVIL,MOTRIN) 800 MG tablet Take 800 mg by mouth every 8 (eight) hours as needed. For pain    . Prenatal Vit-Fe Fumarate-FA (PRENATAL MULTIVITAMIN) 60-1 MG tablet Take 1 tablet by mouth daily.    . ValACYclovir HCl (VALTREX PO) Take 1 tablet by mouth daily.      No current facility-administered medications on file prior to visit.     Past Surgical History:  Procedure Laterality Date  . ABDOMINAL HYSTERECTOMY    . TOOTH EXTRACTION      Allergies  Allergen Reactions  . Sulfa Antibiotics Rash  . Bactrim Hives  . Latex Itching  . Vancomycin Hives  . Ciprofloxacin Hcl Rash    Social History   Socioeconomic History  . Marital status: Married    Spouse name: Not on file  . Number of children: Not on file  . Years of education: Not on file  . Highest education level: Not on file  Occupational History  .  Not on file  Social Needs  . Financial resource strain: Not on file  . Food insecurity:    Worry: Not on file    Inability: Not on file  . Transportation needs:    Medical: Not on file    Non-medical: Not on file  Tobacco Use  . Smoking status: Never Smoker  . Smokeless tobacco: Never Used  Substance and Sexual Activity  . Alcohol use: No  . Drug use: No  . Sexual activity: Never    Birth control/protection: Surgical  Lifestyle  . Physical activity:    Days per week: Not on file    Minutes per session: Not on file  . Stress: Not on file  Relationships  . Social connections:    Talks on phone: Not on file    Gets together: Not on file    Attends religious service: Not on file    Active member of club or organization: Not on file    Attends meetings of clubs or organizations: Not on file    Relationship status: Not on file  . Intimate partner violence:    Fear of current or ex partner: Not on file    Emotionally abused: Not on file    Physically abused: Not on file    Forced sexual activity: Not on file  Other Topics Concern  .  Not on file  Social History Narrative  . Not on file    History reviewed. No pertinent family history.  BP 126/67   Pulse 80   Ht 5\' 4"  (1.626 m)   Wt 250 lb (113.4 kg)   LMP 11/06/2011   BMI 42.91 kg/m   Review of Systems: See HPI above.     Objective:  Physical Exam:  Gen: NAD, comfortable in exam room  Right knee: Mild effusion. No other deformity. No TTP currently. FROM with 5/5 strength. Negative ant/post drawers. Negative valgus/varus testing. Negative lachmanns. Negative mcmurrays, apleys, patellar apprehension. NV intact distally.  Left knee: No deformity. FROM with 5/5 strength. No tenderness to palpation. NVI distally.   MSK u/s right knee:  Mild-mod effusion with synovitis.  Assessment & Plan:  1. Right knee pain - independently reviewed radiographs and no evidence arthropathy.  Ultrasound shows effusion and  synovitis but she does not have warmth, redness, inconsistent with gout or inflammatory arthropathy.  Consistent with synovitis with effusion, possible concurrent patellar tendinitis with her location of pain.  She would like to continue conservative treatment - celebrex, icing, compression, eelvation.  Shown home exercises to do daily.  F/u in 1 month.  Consider aspiration and injection or just injection if not improving.

## 2017-10-25 ENCOUNTER — Encounter: Payer: Self-pay | Admitting: Family Medicine

## 2017-10-25 ENCOUNTER — Ambulatory Visit: Payer: PRIVATE HEALTH INSURANCE | Admitting: Family Medicine

## 2017-10-25 ENCOUNTER — Ambulatory Visit (INDEPENDENT_AMBULATORY_CARE_PROVIDER_SITE_OTHER): Payer: PRIVATE HEALTH INSURANCE | Admitting: Family Medicine

## 2017-10-25 VITALS — BP 133/91 | HR 98 | Ht 64.0 in | Wt 245.0 lb

## 2017-10-25 DIAGNOSIS — M25561 Pain in right knee: Secondary | ICD-10-CM

## 2017-10-25 NOTE — Patient Instructions (Signed)
You're doing great! Take celebrex as needed. Icing and compression also as needed. Knee extensions, straight leg raises, and straight leg raises with foot turned outwards 3 sets of 10 once a day for next 6 weeks - add ankle weight if these become too easy. Follow up with me as needed.

## 2017-10-25 NOTE — Progress Notes (Signed)
PCP: Phillis Knack, MA (Inactive)  Subjective:   HPI: Patient is a 42 y.o. female here for right knee pain.  8/13: Patient reports she's had 1 week of anterior right knee pain. Pain is currently 0 out of 10 but up to 10 out of 10 at times and sharp. She works 12-hour shifts at least 3 days a week and worse by the end of this. She states pain started in the morning and she just woke up with this.  No increase in activity level the prior day. No prior issues with the right knee. She is tried ibuprofen, Tylenol, BC powders. She just started Celebrex which helped last night but not so far today. No skin changes, numbness.  9/17: Patient reports she's doing well. Pain down to 3/10 - no longer waking her up with pain. Taking celebrex as needed. No longer icing or doing compression. No skin changes.  Past Medical History:  Diagnosis Date  . HIV (human immunodeficiency virus infection) (HCC)   . Obesity     Current Outpatient Medications on File Prior to Visit  Medication Sig Dispense Refill  . celecoxib (CELEBREX) 200 MG capsule Take 1 capsule (200 mg total) by mouth 2 (two) times daily. 60 capsule 0  . elvitegravir-cobicistat-emtricitabine-tenofovir (STRIBILD) 150-150-200-300 MG TABS tablet Take 1 tablet by mouth daily with breakfast.    . Prenatal Vit-Fe Fumarate-FA (PRENATAL MULTIVITAMIN) 60-1 MG tablet Take 1 tablet by mouth daily.    . ValACYclovir HCl (VALTREX PO) Take 1 tablet by mouth daily.      No current facility-administered medications on file prior to visit.     Past Surgical History:  Procedure Laterality Date  . ABDOMINAL HYSTERECTOMY    . TOOTH EXTRACTION      Allergies  Allergen Reactions  . Sulfa Antibiotics Rash  . Bactrim Hives  . Latex Itching  . Vancomycin Hives  . Ciprofloxacin Hcl Rash    Social History   Socioeconomic History  . Marital status: Married    Spouse name: Not on file  . Number of children: Not on file  . Years of  education: Not on file  . Highest education level: Not on file  Occupational History  . Not on file  Social Needs  . Financial resource strain: Not on file  . Food insecurity:    Worry: Not on file    Inability: Not on file  . Transportation needs:    Medical: Not on file    Non-medical: Not on file  Tobacco Use  . Smoking status: Never Smoker  . Smokeless tobacco: Never Used  Substance and Sexual Activity  . Alcohol use: No  . Drug use: No  . Sexual activity: Never    Birth control/protection: Surgical  Lifestyle  . Physical activity:    Days per week: Not on file    Minutes per session: Not on file  . Stress: Not on file  Relationships  . Social connections:    Talks on phone: Not on file    Gets together: Not on file    Attends religious service: Not on file    Active member of club or organization: Not on file    Attends meetings of clubs or organizations: Not on file    Relationship status: Not on file  . Intimate partner violence:    Fear of current or ex partner: Not on file    Emotionally abused: Not on file    Physically abused: Not on file  Forced sexual activity: Not on file  Other Topics Concern  . Not on file  Social History Narrative  . Not on file    History reviewed. No pertinent family history.  BP (!) 133/91   Pulse 98   Ht 5\' 4"  (1.626 m)   Wt 245 lb (111.1 kg)   LMP 11/06/2011   BMI 42.05 kg/m   Review of Systems: See HPI above.     Objective:  Physical Exam:  Gen: NAD, comfortable in exam room  Right knee: No gross deformity, ecchymoses, swelling. No TTP. FROM with 5/5 strength. Negative ant/post drawers. Negative valgus/varus testing. Negative lachmans. Negative mcmurrays, apleys. NV intact distally.  Assessment & Plan:  1. Right knee pain - 2/2 effusion with synovitis.  Clinically improving.  Continue with celebrex as needed.  Home exercises reviewed to do over next 6 weeks.  Icing, compression if needed.  F/u prn.

## 2019-05-23 IMAGING — CR DG KNEE COMPLETE 4+V*R*
4 series · 4 of 4 positions shown · non-contrast
Comparison: None.

CLINICAL DATA: Pain.

EXAM:
RIGHT KNEE - COMPLETE 4+ VIEW

[t knee ap right]
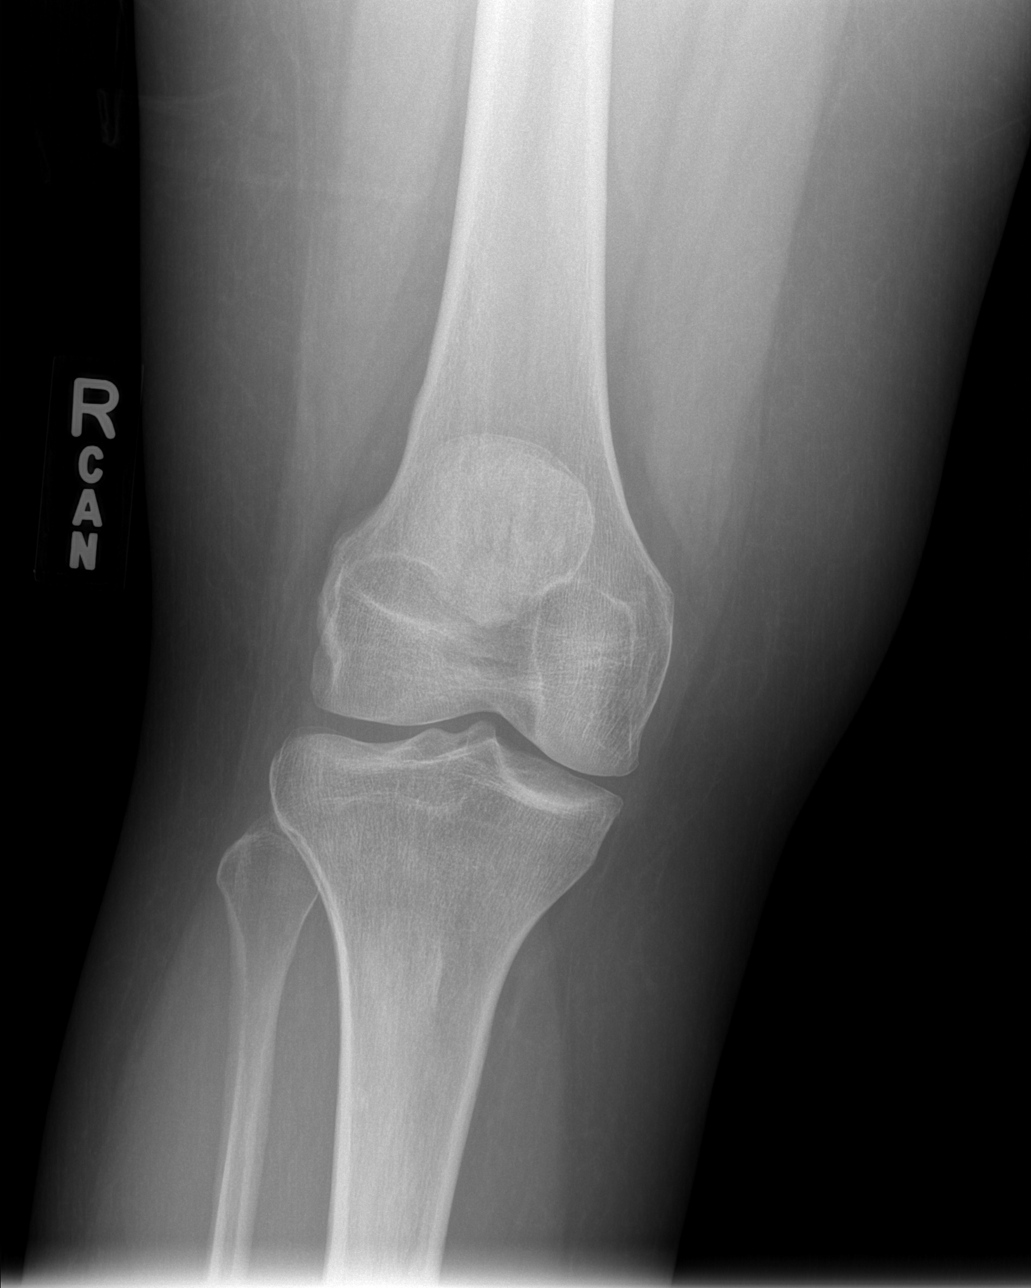

[t knee oblique right (1 of 2)]
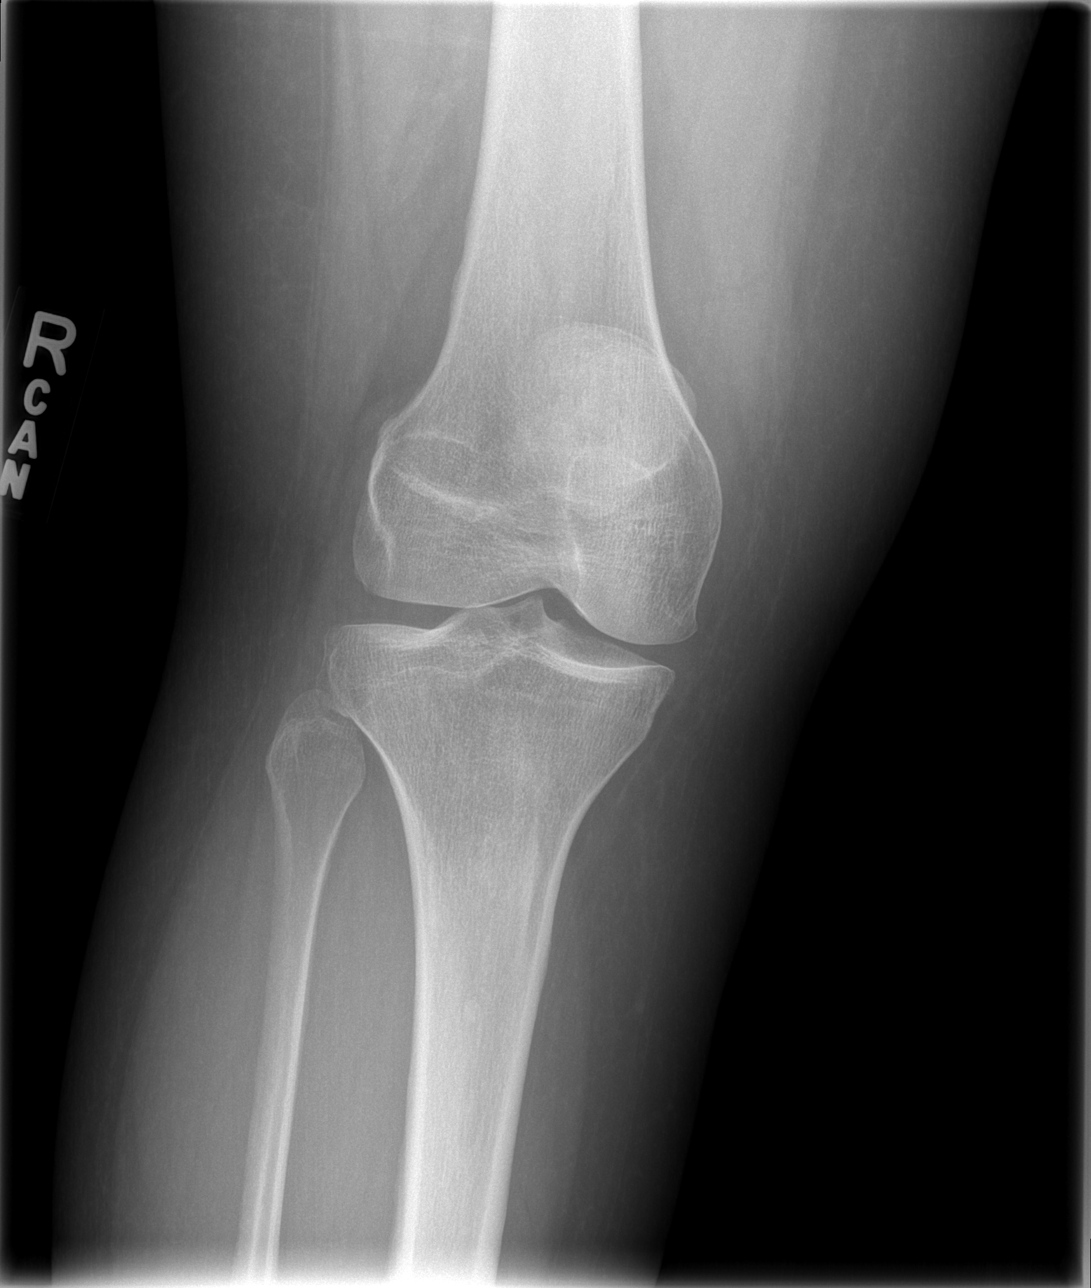

[t knee oblique right (2 of 2)]
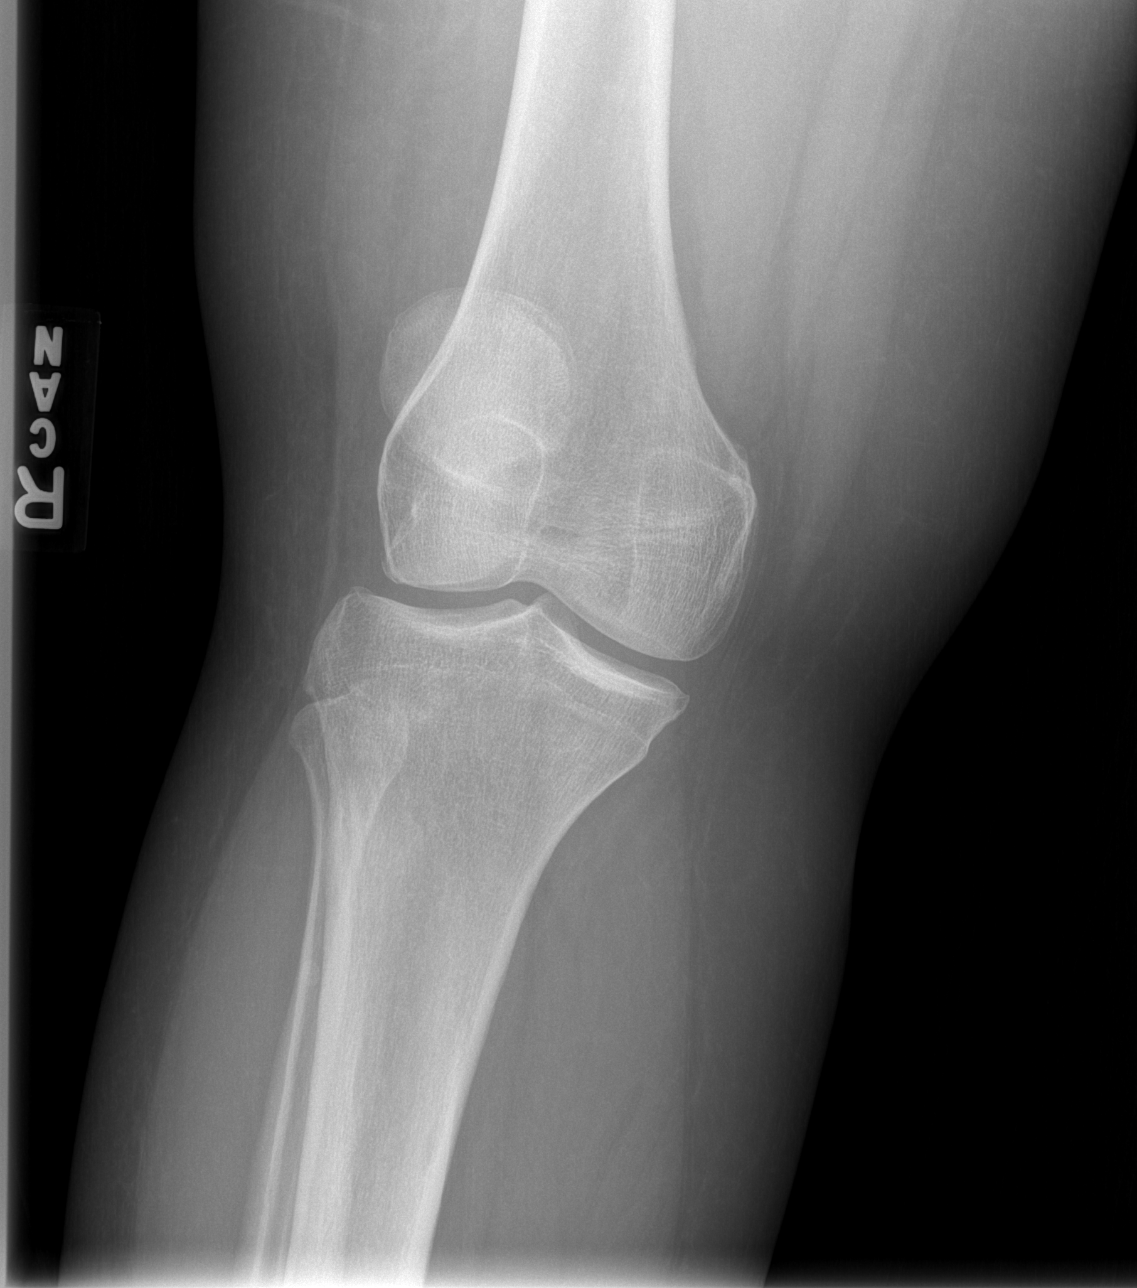

[t knee lat right]
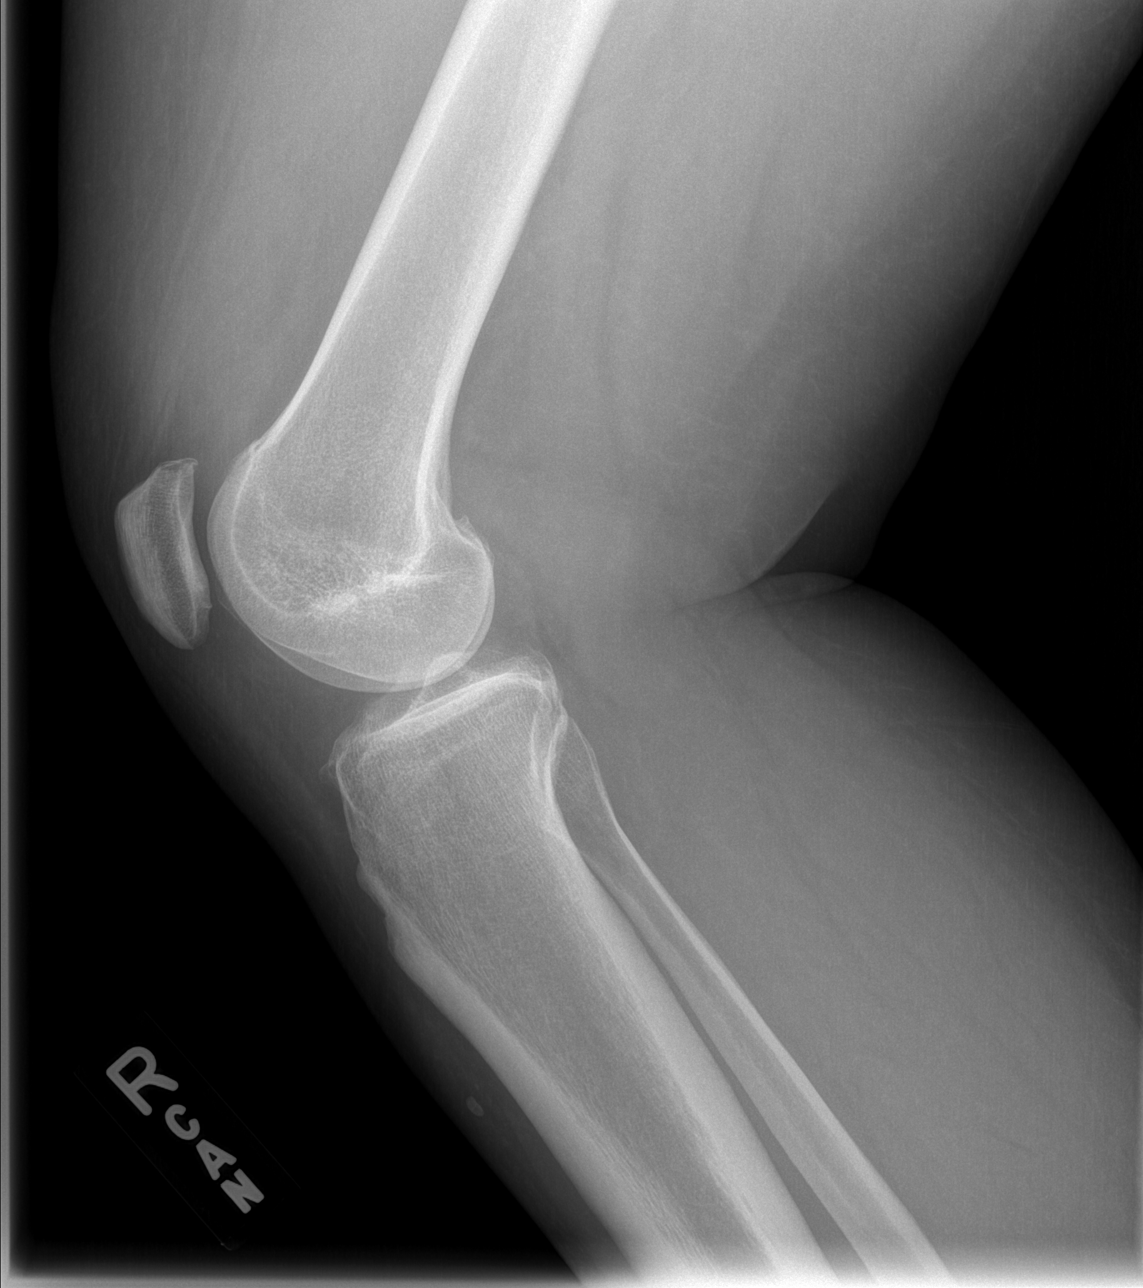

[4 of 4 positions shown; findings below may reference images not displayed]

FINDINGS: No joint effusion noted. Minimal patellofemoral degenerative
changes. No fractures. No bony lesions.
IMPRESSION: Negative.

## 2020-06-15 LAB — HM PAP SMEAR: HPV Aptima: POSITIVE

## 2020-12-03 ENCOUNTER — Emergency Department (HOSPITAL_COMMUNITY)
Admission: EM | Admit: 2020-12-03 | Discharge: 2020-12-03 | Disposition: A | Discharge status: Home / Self Care | Payer: BC Managed Care – PPO | Attending: Emergency Medicine | Admitting: Emergency Medicine

## 2020-12-03 ENCOUNTER — Other Ambulatory Visit: Payer: Self-pay

## 2020-12-03 ENCOUNTER — Encounter (HOSPITAL_COMMUNITY): Payer: Self-pay | Admitting: *Deleted

## 2020-12-03 DIAGNOSIS — Z21 Asymptomatic human immunodeficiency virus [HIV] infection status: Secondary | ICD-10-CM | POA: Diagnosis not present

## 2020-12-03 DIAGNOSIS — Z91040 Latex allergy status: Secondary | ICD-10-CM | POA: Diagnosis not present

## 2020-12-03 DIAGNOSIS — R0789 Other chest pain: Secondary | ICD-10-CM | POA: Diagnosis present

## 2020-12-03 DIAGNOSIS — Y93I9 Activity, other involving external motion: Secondary | ICD-10-CM | POA: Insufficient documentation

## 2020-12-03 DIAGNOSIS — M549 Dorsalgia, unspecified: Secondary | ICD-10-CM | POA: Diagnosis not present

## 2020-12-03 MED ORDER — DICLOFENAC SODIUM 25 MG PO TBEC: 25.0000 mg | DELAYED_RELEASE_TABLET | Freq: Two times a day (BID) | ORAL | 0 refills | Status: AC

## 2020-12-03 MED ORDER — LIDOCAINE 5 % EX PTCH: 1.0000 | MEDICATED_PATCH | CUTANEOUS | 0 refills | Status: DC

## 2020-12-03 MED ORDER — METHOCARBAMOL 500 MG PO TABS: 500.0000 mg | ORAL_TABLET | Freq: Two times a day (BID) | ORAL | 0 refills | Status: DC
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
## 2020-12-03 NOTE — Discharge Instructions (Signed)
Warm compresses to sore muscles for 20 minutes at a time followed by gentle stretching. Take Tylenol and Robaxin as needed as directed. Apply Lidoderm patch as needed as prescribed. Add Diclofenac if needed for additional pain relief. Avoid any other NSAIDs.  Recheck with your PCP in 3-5 days if not improving. Return to ER for worsening or concerning symptoms.
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
## 2022-02-04 ENCOUNTER — Encounter (HOSPITAL_COMMUNITY): Payer: Self-pay | Admitting: Emergency Medicine

## 2022-02-04 ENCOUNTER — Ambulatory Visit (HOSPITAL_COMMUNITY)
Admission: EM | Admit: 2022-02-04 | Discharge: 2022-02-04 | Disposition: A | Discharge status: Home / Self Care | Payer: BC Managed Care – PPO | Attending: Internal Medicine | Admitting: Internal Medicine

## 2022-02-04 DIAGNOSIS — U071 COVID-19: Secondary | ICD-10-CM

## 2022-02-04 DIAGNOSIS — R051 Acute cough: Secondary | ICD-10-CM

## 2022-02-04 MED ORDER — IBUPROFEN 800 MG PO TABS
800.0000 mg | ORAL_TABLET | Freq: Once | ORAL | Status: DC
Start: 2022-02-04 — End: 2022-02-04

## 2022-02-04 MED ORDER — ACETAMINOPHEN 325 MG PO TABS: 975.0000 mg | ORAL_TABLET | Freq: Once | ORAL | Status: DC

## 2022-02-04 MED ORDER — MOLNUPIRAVIR EUA 200MG CAPSULE: 4.0000 | ORAL_CAPSULE | Freq: Two times a day (BID) | ORAL | 0 refills | Status: AC

## 2022-02-04 MED ORDER — PROMETHAZINE-DM 6.25-15 MG/5ML PO SYRP: 5.0000 mL | ORAL_SOLUTION | Freq: Four times a day (QID) | ORAL | 0 refills | Status: DC | PRN

## 2022-02-04 MED ORDER — BENZONATATE 100 MG PO CAPS: 100.0000 mg | ORAL_CAPSULE | Freq: Three times a day (TID) | ORAL | 0 refills | Status: DC

## 2022-02-04 NOTE — ED Triage Notes (Signed)
Pt started feeling bad yesterday. Had cough that is productive, headache/body aches, congestion, sore throat. Hasn't taken any medication for symptoms positive home covid test

## 2022-02-04 NOTE — Discharge Instructions (Addendum)
You have COVID-19.  You must stay at home until day 6 of symptoms. On day 6, you may go out into public and go back to work, but you must wear a mask until day 11 of symptoms to prevent spread to others.  I have provided a work note for you to return to work on day 6.  Take molnupiravir antiviral medicine as directed for the next 5 days.  This will prevent severe symptoms and shorten the length of your viral illness by stopping the virus from replicating in your body.  Purchase mucinex (guaifenesin) 1200mg  and take this every 12 hours for the next few days to thin your nasal congestion and mucous so that you are able to get out of your body easier by coughing and blowing your nose. Drink plenty of water while taking this.  Take tessalon pearles every 8 hours as needed for cough.  Take Promethazine DM cough medication to help with your cough at nighttime so that you are able to sleep. Do not drive, drink alcohol, or go to work while taking this medication since it can make you sleepy. Only take this at nighttime.   You may take tylenol 1,000mg  and ibuprofen 600mg  every 6 hours with food as needed for fever/chills, sore throat, aches/pains, and inflammation associated with viral illness. Take this with food to avoid stomach upset.    You may do salt water and baking soda gargles every 4 hours as needed for your throat pain.  Please put 1 teaspoon of salt and 1/2 teaspoon of baking soda in 8 ounces of warm water then gargle and spit the water out. You may also put 1 tablespoon of honey in warm water and drink this to soothe your throat.  Place a humidifier in your room at night to help decrease dry air that can irritate your airway and cause you to have a sore throat and cough.  Please try to eat a well-balanced diet while you are sick so that your body gets proper nutrition to heal.  If you develop any new or worsening symptoms, please return.  If your symptoms are severe, please go to the emergency  room.  Follow-up with your primary care provider for further evaluation and management of your symptoms as well as ongoing wellness visits.  I hope you feel better!

## 2022-02-04 NOTE — ED Provider Notes (Signed)
[facility]    CSN: [id number] Arrival date & time: 02/04/22  1332      History   Chief Complaint Chief Complaint  Patient presents with   Cough   Covid Positive    HPI Carrie Dominguez is a 46 y.o. female.   Patient presents urgent care for evaluation of cough, nasal congestion, sore throat, body aches, generalized headache, and chills that started yesterday.  Initial COVID test yesterday was negative.  She took a second COVID test at work last night and this was positive.  Denies shortness of breath, chest pain, weakness, nausea, vomiting, abdominal pain, dizziness, diarrhea, and decreased oral intake.  No anosmia.  No known sick contacts with similar symptoms.  She is not vaccinated against COVID-19 or influenza.  Headache is generalized and she denies blurry vision or decreased visual acuity.  No photophobia or phonophobia.  She has had COVID-19 in the past but denies history of hospitalization related to COVID-19.  She does not have any chronic respiratory problems but is HIV positive and currently takes Stribild daily for the last 20 years.  She is not a smoker and denies drug use.  She has not attempted use of any over-the-counter medicines prior to arrival urgent care for symptoms.   Cough   Past Medical History:  Diagnosis Date   HIV (human immunodeficiency virus infection) (HCC)    Obesity     Patient Active Problem List   Diagnosis Date Noted   History of hysterectomy for benign disease 01/01/2015   Human immunodeficiency virus (HIV) disease (HCC) 12/01/2012   Anxiety 12/01/2012    Past Surgical History:  Procedure Laterality Date   ABDOMINAL HYSTERECTOMY     TOOTH EXTRACTION      OB History     Gravida  1   Para      Term      Preterm      AB      Living         SAB      IAB      Ectopic      Multiple      Live Births               Home Medications    Prior to Admission medications   Medication Sig Start Date End  Date Taking? Authorizing Provider  benzonatate (TESSALON) 100 MG capsule Take 1 capsule (100 mg total) by mouth every 8 (eight) hours. 02/04/22  Yes Carlisle Beers, FNP  promethazine-dextromethorphan (PROMETHAZINE-DM) 6.25-15 MG/5ML syrup Take 5 mLs by mouth 4 (four) times daily as needed for cough. 02/04/22  Yes Carlisle Beers, FNP  elvitegravir-cobicistat-emtricitabine-tenofovir (STRIBILD) 150-150-200-300 MG TABS tablet Take 1 tablet by mouth daily with breakfast.    [provider]  lidocaine (LIDODERM) 5 % Place 1 patch onto the skin daily. Remove & Discard patch within 12 hours or as directed by MD 12/03/20   Jeannie Fend, PA-C  methocarbamol (ROBAXIN) 500 MG tablet Take 1 tablet (500 mg total) by mouth 2 (two) times daily. 12/03/20   Jeannie Fend, PA-C  Prenatal Vit-Fe Fumarate-FA (PRENATAL MULTIVITAMIN) 60-1 MG tablet Take 1 tablet by mouth daily.    Vanetta Mulders, MD  ValACYclovir HCl (VALTREX PO) Take 1 tablet by mouth daily.     [provider]    Family History No family history on file.  Social History Social History   Tobacco Use   Smoking status: Never   Smokeless tobacco: Never  Substance Use Topics   Alcohol use: No   Drug use: No     Allergies   Sulfa antibiotics, Bactrim, Latex, Vancomycin, and Ciprofloxacin hcl   Review of Systems Review of Systems  Respiratory:  Positive for cough.   Per HPI   Physical Exam Triage Vital Signs ED Triage Vitals  Enc Vitals Group     BP 02/04/22 1659 118/84     Pulse Rate 02/04/22 1659 (!) 108     Resp 02/04/22 1659 17     Temp 02/04/22 1659 99.8 F (37.7 C)     Temp src --      SpO2 02/04/22 1659 94 %     Weight --      Height --      Head Circumference --      Peak Flow --      Pain Score 02/04/22 1657 8     Pain Loc --      Pain Edu? --      Excl. in GC? --    No data found.  Updated Vital Signs BP 118/84 (BP Location: Left Arm)   Pulse (!) 108   Temp 99.8 F  (37.7 C)   Resp 17   LMP 11/06/2011   SpO2 94%   Visual Acuity Right Eye Distance:   Left Eye Distance:   Bilateral Distance:    Right Eye Near:   Left Eye Near:    Bilateral Near:     Physical Exam Vitals and nursing note reviewed.  Constitutional:      Appearance: She is not ill-appearing or toxic-appearing.  HENT:     Head: Normocephalic and atraumatic.     Right Ear: Hearing, tympanic membrane, ear canal and external ear normal.     Left Ear: Hearing, tympanic membrane, ear canal and external ear normal.     Nose: Congestion present.     Mouth/Throat:     Lips: Pink.     Mouth: Mucous membranes are moist.     Pharynx: No posterior oropharyngeal erythema.  Eyes:     General: Lids are normal. Vision grossly intact. Gaze aligned appropriately.        Right eye: No discharge.        Left eye: No discharge.     Extraocular Movements: Extraocular movements intact.     Conjunctiva/sclera: Conjunctivae normal.  Cardiovascular:     Rate and Rhythm: Normal rate and regular rhythm.     Heart sounds: Normal heart sounds, S1 normal and S2 normal.  Pulmonary:     Effort: Pulmonary effort is normal. No respiratory distress.     Breath sounds: Normal breath sounds and air entry.  Musculoskeletal:     Cervical back: Neck supple.     Right lower leg: No edema.     Left lower leg: No edema.  Lymphadenopathy:     Cervical: Cervical adenopathy present.  Skin:    General: Skin is warm and dry.     Capillary Refill: Capillary refill takes less than 2 seconds.     Findings: No rash.  Neurological:     General: No focal deficit present.     Mental Status: She is alert and oriented to person, place, and time. Mental status is at baseline.     Cranial Nerves: No dysarthria or facial asymmetry.  Psychiatric:        Mood and Affect: Mood normal.        Speech: Speech normal.  Behavior: Behavior normal.        Thought Content: Thought content normal.        Judgment: Judgment  normal.      UC Treatments / Results  Labs (all labs ordered are listed, but only abnormal results are displayed) Labs Reviewed - No data to display  EKG   Radiology No results found.  Procedures Procedures (including critical care time)  Medications Ordered in UC Medications  ibuprofen (ADVIL) tablet 800 mg (has no administration in time range)    Initial Impression / Assessment and Plan / UC Course  I have reviewed the triage vital signs and the nursing notes.  Pertinent labs & imaging results that were available during my care of the patient were reviewed by me and considered in my medical decision making (see chart for details).  COVID-19 COVID test at home was positive.  No indication for imaging today based on stable cardiopulmonary exam and hemodynamically stable vital signs.  Will manage this with antiviral therapy due to timing of illness as well as prescriptions for conservative treatment and symptomatic relief.  Patient given 800 mg ibuprofen in clinic today for headache.  Molnupiravir, Tessalon Perles, and Promethazine DM sent to pharmacy for symptomatic relief to be taken as prescribed. Promethazine DM cough medication may be used as needed only at bedtime due to possible drowsiness side effect (no alcohol, working, or driving while taking this advised).  May purchase guaifenesin and take this as directed every 12 hours as needed for nasal congestion and cough. May use ibuprofen/tylenol over the counter for body aches, fever/chills, and overall discomfort associated with viral illness. Nonpharmacologic interventions for symptom relief provided and after visit summary below.   Strict ED/urgent care return precautions given.  Patient verbalizes understanding and agreement with plan.  Counseled patient regarding possible side effects and uses of all medications prescribed at today's visit.  Patient verbalizes understanding and agreement with plan.  All questions answered.   Patient discharged from urgent care in stable condition.        Final Clinical Impressions(s) / UC Diagnoses   Final diagnoses:  COVID-19  Acute cough     Discharge Instructions      You have COVID-19.  You must stay at home until day 6 of symptoms. On day 6, you may go out into public and go back to work, but you must wear a mask until day 11 of symptoms to prevent spread to others.  I have provided a work note for you to return to work on day 6.  Purchase mucinex (guaifenesin) 1200mg  and take this every 12 hours for the next few days to thin your nasal congestion and mucous so that you are able to get out of your body easier by coughing and blowing your nose. Drink plenty of water while taking this.  Take tessalon pearles every 8 hours as needed for cough.  Take Promethazine DM cough medication to help with your cough at nighttime so that you are able to sleep. Do not drive, drink alcohol, or go to work while taking this medication since it can make you sleepy. Only take this at nighttime.   You may take tylenol 1,000mg  and ibuprofen 600mg  every 6 hours with food as needed for fever/chills, sore throat, aches/pains, and inflammation associated with viral illness. Take this with food to avoid stomach upset.    You may do salt water and baking soda gargles every 4 hours as needed for your throat pain.  Please  put 1 teaspoon of salt and 1/2 teaspoon of baking soda in 8 ounces of warm water then gargle and spit the water out. You may also put 1 tablespoon of honey in warm water and drink this to soothe your throat.  Place a humidifier in your room at night to help decrease dry air that can irritate your airway and cause you to have a sore throat and cough.  Please try to eat a well-balanced diet while you are sick so that your body gets proper nutrition to heal.  If you develop any new or worsening symptoms, please return.  If your symptoms are severe, please go to the emergency room.   Follow-up with your primary care provider for further evaluation and management of your symptoms as well as ongoing wellness visits.  I hope you feel better!     ED Prescriptions     Medication Sig Dispense Auth. Provider   benzonatate (TESSALON) 100 MG capsule Take 1 capsule (100 mg total) by mouth every 8 (eight) hours. 21 capsule Carlisle BeersStanhope, Anylah Scheib M, FNP   promethazine-dextromethorphan (PROMETHAZINE-DM) 6.25-15 MG/5ML syrup Take 5 mLs by mouth 4 (four) times daily as needed for cough. 118 mL Carlisle BeersStanhope, Nekayla Heider M, FNP      PDMP not reviewed this encounter.   Carlisle BeersStanhope, Aman Batley M, [place]FNP 02/04/22 1739

## 2023-03-25 DIAGNOSIS — Z21 Asymptomatic human immunodeficiency virus [HIV] infection status: Secondary | ICD-10-CM | POA: Diagnosis not present

## 2023-04-20 NOTE — Progress Notes (Unsigned)
 New patient visit   Patient: Carrie Dominguez   DOB: 07-Mar-1975   48 y.o. Female  MRN: [id number] Visit Date: 04/21/2023  Today's healthcare provider: Alfredia Ferguson, PA-C   No chief complaint on file.  Subjective    Carrie Dominguez is a 48 y.o. female who presents today as a new patient to establish care.   HIV-- infectious disease [facility] ; last switch to injectable cabenuva ? 01/28/23 next due 03/25/23-- was admin looks like next attp 05/18/23  Type 2 dm -- metformin, lantus 50 U , ozempic 1  Lisinopril kidney Atorv 10   Past Medical History:  Diagnosis Date   HIV (human immunodeficiency virus infection) (HCC)    Obesity    Past Surgical History:  Procedure Laterality Date   ABDOMINAL HYSTERECTOMY     TOOTH EXTRACTION     No family status information on file.   No family history on file. Social History   Socioeconomic History   Marital status: Married    Spouse name: Not on file   Number of children: Not on file   Years of education: Not on file   Highest education level: Not on file  Occupational History   Not on file  Tobacco Use   Smoking status: Never   Smokeless tobacco: Never  Substance and Sexual Activity   Alcohol use: No   Drug use: No   Sexual activity: Never    Birth control/protection: Surgical  Other Topics Concern   Not on file  Social History Narrative   Not on file   Social Drivers of Health   Financial Resource Strain: Not on file  Food Insecurity: Low Risk  (03/25/2023)   Received from [facility]   Hunger Vital Sign    Worried About Running Out of Food in the Last Year: Never true    Ran Out of Food in the Last Year: Never true  Transportation Needs: No Transportation Needs (03/25/2023)   Received from [facility]    In the past 12 months, has lack of reliable transportation kept you from medical appointments, meetings, work or from getting things needed for daily living? : No  Physical Activity:  Not on file  Stress: Not on file  Social Connections: Unknown (06/08/2021)   [facility] from [facility]   Social Network    Social Network: Not on file   Outpatient Medications Prior to Visit  Medication Sig   benzonatate (TESSALON) 100 MG capsule Take 1 capsule (100 mg total) by mouth every 8 (eight) hours.   elvitegravir-cobicistat-emtricitabine-tenofovir (STRIBILD) 150-150-200-300 MG TABS tablet Take 1 tablet by mouth daily with breakfast.   lidocaine (LIDODERM) 5 % Place 1 patch onto the skin daily. Remove & Discard patch within 12 hours or as directed by MD   methocarbamol (ROBAXIN) 500 MG tablet Take 1 tablet (500 mg total) by mouth 2 (two) times daily.   Prenatal Vit-Fe Fumarate-FA (PRENATAL MULTIVITAMIN) 60-1 MG tablet Take 1 tablet by mouth daily.   promethazine-dextromethorphan (PROMETHAZINE-DM) 6.25-15 MG/5ML syrup Take 5 mLs by mouth 4 (four) times daily as needed for cough.   ValACYclovir HCl (VALTREX PO) Take 1 tablet by mouth daily.    No facility-administered medications prior to visit.   Allergies  Allergen Reactions   Sulfa Antibiotics Rash   Bactrim Hives   Latex Itching   Vancomycin Hives   Ciprofloxacin Hcl Rash    Immunization History  Administered Date(s) Administered   Moderna Sars-Covid-2 Vaccination  03/06/2019, 04/14/2019, 11/29/2020    Health Maintenance  Topic Date Due   Pneumococcal Vaccine 28-25 Years old (1 of 2 - PCV) Never done   Hepatitis C Screening  Never done   DTaP/Tdap/Td (1 - Tdap) Never done   INFLUENZA VACCINE  09/09/2022   COVID-19 Vaccine (4 - 2024-25 season) 10/10/2022   Colonoscopy  05/31/2032   HIV Screening  Completed   HPV VACCINES  Aged Out    Patient Care Team: Phillis Knack, MA (Inactive) as PCP - General (Gynecology)  Review of Systems  {Insert previous labs (optional):23779} {See past labs  Heme  Chem  Endocrine  Serology  Results Review (optional):1}   Objective    LMP 11/06/2011   {Insert last BP/Wt (optional):23777}{See vitals history (optional):1}   Physical Exam ***  Depression Screen     No data to display         No results found for any visits on 04/21/23.  Assessment & Plan     There are no diagnoses linked to this encounter.   No follow-ups on file.      Alfredia Ferguson, PA-C  [facility] [phone] (phone) [phone] (fax)  [facility]

## 2023-04-21 ENCOUNTER — Telehealth (HOSPITAL_BASED_OUTPATIENT_CLINIC_OR_DEPARTMENT_OTHER): Payer: Self-pay

## 2023-04-21 ENCOUNTER — Encounter: Payer: Self-pay | Admitting: Physician Assistant

## 2023-04-21 ENCOUNTER — Other Ambulatory Visit: Payer: Self-pay | Admitting: Physician Assistant

## 2023-04-21 ENCOUNTER — Ambulatory Visit: Payer: BC Managed Care – PPO | Admitting: Physician Assistant

## 2023-04-21 VITALS — BP 127/87 | HR 84 | Ht 64.0 in | Wt 239.0 lb

## 2023-04-21 DIAGNOSIS — R87619 Unspecified abnormal cytological findings in specimens from cervix uteri: Secondary | ICD-10-CM | POA: Insufficient documentation

## 2023-04-21 DIAGNOSIS — Z1231 Encounter for screening mammogram for malignant neoplasm of breast: Secondary | ICD-10-CM

## 2023-04-21 DIAGNOSIS — B009 Herpesviral infection, unspecified: Secondary | ICD-10-CM | POA: Diagnosis not present

## 2023-04-21 DIAGNOSIS — E1165 Type 2 diabetes mellitus with hyperglycemia: Secondary | ICD-10-CM | POA: Diagnosis not present

## 2023-04-21 DIAGNOSIS — Z7984 Long term (current) use of oral hypoglycemic drugs: Secondary | ICD-10-CM

## 2023-04-21 DIAGNOSIS — B20 Human immunodeficiency virus [HIV] disease: Secondary | ICD-10-CM

## 2023-04-21 DIAGNOSIS — D696 Thrombocytopenia, unspecified: Secondary | ICD-10-CM

## 2023-04-21 LAB — COMPREHENSIVE METABOLIC PANEL
ALT: 15 U/L (ref 0–35)
AST: 12 U/L (ref 0–37)
Albumin: 4.2 g/dL (ref 3.5–5.2)
Alkaline Phosphatase: 46 U/L (ref 39–117)
BUN: 12 mg/dL (ref 6–23)
CO2: 28 meq/L (ref 19–32)
Calcium: 9.2 mg/dL (ref 8.4–10.5)
Chloride: 103 meq/L (ref 96–112)
Creatinine, Ser: 0.9 mg/dL (ref 0.40–1.20)
GFR: 76.33 mL/min (ref >60.00)
Glucose, Bld: 229 mg/dL — ABNORMAL HIGH (ref 70–99)
Potassium: 4.5 meq/L (ref 3.5–5.1)
Sodium: 137 meq/L (ref 135–145)
Total Bilirubin: 0.5 mg/dL (ref 0.2–1.2)
Total Protein: 7 g/dL (ref 6.0–8.3)

## 2023-04-21 LAB — LIPID PANEL
Cholesterol: 182 mg/dL (ref 0–200)
HDL: 39.8 mg/dL (ref >39.00)
LDL Cholesterol: 118 mg/dL — ABNORMAL HIGH (ref 0–99)
NonHDL: 141.7
Total CHOL/HDL Ratio: 5
Triglycerides: 120 mg/dL (ref 0.0–149.0)
VLDL: 24 mg/dL (ref 0.0–40.0)

## 2023-04-21 LAB — CBC WITH DIFFERENTIAL/PLATELET
Basophils Absolute: 0 10*3/uL (ref 0.0–0.1)
Basophils Relative: 0.5 % (ref 0.0–3.0)
Eosinophils Absolute: 0 10*3/uL (ref 0.0–0.7)
Eosinophils Relative: 0.8 % (ref 0.0–5.0)
HCT: 39.1 % (ref 36.0–46.0)
Hemoglobin: 12.8 g/dL (ref 12.0–15.0)
Lymphocytes Relative: 39.8 % (ref 12.0–46.0)
Lymphs Abs: 2.2 10*3/uL (ref 0.7–4.0)
MCHC: 32.8 g/dL (ref 30.0–36.0)
MCV: 92.9 fl (ref 78.0–100.0)
Monocytes Absolute: 0.4 10*3/uL (ref 0.1–1.0)
Monocytes Relative: 7.5 % (ref 3.0–12.0)
Neutro Abs: 2.9 10*3/uL (ref 1.4–7.7)
Neutrophils Relative %: 51.4 % (ref 43.0–77.0)
Platelets: 149 10*3/uL — ABNORMAL LOW (ref 150.0–400.0)
RBC: 4.21 Mil/uL (ref 3.87–5.11)
RDW: 12.8 % (ref 11.5–15.5)
WBC: 5.6 10*3/uL (ref 4.0–10.5)

## 2023-04-21 LAB — HEMOGLOBIN A1C: Hgb A1c MFr Bld: 9.7 % — ABNORMAL HIGH (ref 4.6–6.5)

## 2023-04-21 MED ORDER — METFORMIN HCL ER 500 MG PO TB24
500.0000 mg | ORAL_TABLET | Freq: Every day | ORAL | 1 refills | Status: DC
  Filled 2023-07-13: qty 58, 58d supply, fill #0
  Filled 2023-07-13: qty 32, 32d supply, fill #0
  Filled 2023-11-08: qty 60, 60d supply, fill #1

## 2023-04-21 MED ORDER — BASAGLAR KWIKPEN 100 UNIT/ML ~~LOC~~ SOPN: 50.0000 [IU] | PEN_INJECTOR | Freq: Every day | SUBCUTANEOUS | 3 refills | Status: DC

## 2023-04-21 MED ORDER — VALACYCLOVIR HCL 1 G PO TABS
1000.0000 mg | ORAL_TABLET | Freq: Every day | ORAL | 1 refills | Status: AC
Start: 2023-04-21 — End: ?
  Filled 2023-06-06: qty 90, 90d supply, fill #0
  Filled 2023-11-08: qty 60, 60d supply, fill #1

## 2023-04-21 MED ORDER — BD PEN NEEDLE NANO 2ND GEN 32G X 4 MM MISC: 2 refills | Status: AC

## 2023-04-21 MED ORDER — LISINOPRIL 10 MG PO TABS
10.0000 mg | ORAL_TABLET | Freq: Every day | ORAL | 1 refills | Status: DC
Start: 2023-04-21 — End: 2023-09-23
  Filled 2023-08-01: qty 90, 90d supply, fill #0

## 2023-04-21 MED ORDER — TIRZEPATIDE 2.5 MG/0.5ML ~~LOC~~ SOAJ: 2.5000 mg | SUBCUTANEOUS | 0 refills | Status: DC

## 2023-04-21 MED ORDER — TIRZEPATIDE 5 MG/0.5ML ~~LOC~~ SOAJ
5.0000 mg | SUBCUTANEOUS | 1 refills | Status: DC
  Filled 2023-06-21: qty 2, 28d supply, fill #0

## 2023-04-21 MED ORDER — DEXCOM G7 SENSOR MISC
3 refills | Status: DC
  Filled 2023-08-01: qty 3, 30d supply, fill #0
  Filled 2023-10-04: qty 3, 30d supply, fill #1
  Filled 2023-12-05: qty 3, 30d supply, fill #2
  Filled 2024-01-20 – 2024-01-27 (×2): qty 1, 10d supply, fill #3

## 2023-04-21 MED ORDER — ATORVASTATIN CALCIUM 10 MG PO TABS
10.0000 mg | ORAL_TABLET | Freq: Every day | ORAL | 3 refills | Status: DC
Start: 2023-04-21 — End: 2023-09-23

## 2023-04-21 NOTE — Assessment & Plan Note (Signed)
 Daily suppression therapy, valtrex 1 g

## 2023-04-21 NOTE — Assessment & Plan Note (Signed)
 Per pt. Unable to find records. Referring to gyn

## 2023-04-21 NOTE — Assessment & Plan Note (Addendum)
 She has been sporadically using her parent's insulin. Restarting today, lantus 50 U daily, metformin 500 mg XR, and since she has been off ozempic for several months-- switch to mounjaro 2.5 mg weeklyx 4 weeks, and then 5 mg weekly.  She uses a dexcom, reordered -- pt aware how to titrate insulin down if needed since we are adding back other agents  Ordering A1c, lipids, uacr, cmp,cbc.  Re-starting statin and acei.  F/u 3 mo or earlier if needed Referring to endo per pt preference-- and to see if at this point she is insulin dependent? Dx of dm ~ 4-5 years ago, mainly managed with insulin

## 2023-04-21 NOTE — Assessment & Plan Note (Signed)
 Pt prefers to stay with [facility].  On injectable cabenuva.

## 2023-04-26 ENCOUNTER — Encounter: Payer: Self-pay | Admitting: Physician Assistant

## 2023-04-27 ENCOUNTER — Encounter: Payer: Self-pay | Admitting: *Deleted

## 2023-04-28 ENCOUNTER — Ambulatory Visit (HOSPITAL_BASED_OUTPATIENT_CLINIC_OR_DEPARTMENT_OTHER)
Admission: RE | Admit: 2023-04-28 | Discharge: 2023-04-28 | Disposition: A | Discharge status: Home / Self Care | Source: Ambulatory Visit | Attending: Physician Assistant | Admitting: Physician Assistant

## 2023-04-28 ENCOUNTER — Telehealth: Payer: Self-pay

## 2023-04-28 ENCOUNTER — Encounter (HOSPITAL_BASED_OUTPATIENT_CLINIC_OR_DEPARTMENT_OTHER): Payer: Self-pay

## 2023-04-28 ENCOUNTER — Telehealth: Payer: Self-pay | Admitting: *Deleted

## 2023-04-28 ENCOUNTER — Other Ambulatory Visit (HOSPITAL_COMMUNITY): Payer: Self-pay

## 2023-04-28 ENCOUNTER — Ambulatory Visit
Admission: RE | Admit: 2023-04-28 | Discharge: 2023-04-28 | Disposition: A | Discharge status: Home / Self Care | Source: Ambulatory Visit | Attending: Family Medicine | Admitting: Family Medicine

## 2023-04-28 ENCOUNTER — Ambulatory Visit (HOSPITAL_BASED_OUTPATIENT_CLINIC_OR_DEPARTMENT_OTHER)
Admission: RE | Admit: 2023-04-28 | Discharge: 2023-04-28 | Disposition: A | Discharge status: Home / Self Care | Source: Ambulatory Visit | Attending: Family Medicine | Admitting: Family Medicine

## 2023-04-28 ENCOUNTER — Other Ambulatory Visit: Payer: Self-pay | Admitting: Physician Assistant

## 2023-04-28 VITALS — BP 147/87 | HR 89 | Temp 98.8°F | Resp 16

## 2023-04-28 DIAGNOSIS — M25532 Pain in left wrist: Secondary | ICD-10-CM | POA: Insufficient documentation

## 2023-04-28 DIAGNOSIS — M84439A Pathological fracture, unspecified ulna and radius, initial encounter for fracture: Secondary | ICD-10-CM | POA: Diagnosis not present

## 2023-04-28 DIAGNOSIS — Z1231 Encounter for screening mammogram for malignant neoplasm of breast: Secondary | ICD-10-CM | POA: Diagnosis not present

## 2023-04-28 DIAGNOSIS — M19032 Primary osteoarthritis, left wrist: Secondary | ICD-10-CM | POA: Diagnosis not present

## 2023-04-28 DIAGNOSIS — E1165 Type 2 diabetes mellitus with hyperglycemia: Secondary | ICD-10-CM

## 2023-04-28 DIAGNOSIS — Z8781 Personal history of (healed) traumatic fracture: Secondary | ICD-10-CM | POA: Diagnosis not present

## 2023-04-28 MED ORDER — KETOROLAC TROMETHAMINE 30 MG/ML IJ SOLN
30.0000 mg | Freq: Once | INTRAMUSCULAR | Status: AC
  Administered 2023-04-28: 30 mg via INTRAMUSCULAR

## 2023-04-28 MED ORDER — INSULIN GLARGINE (1 UNIT DIAL) 300 UNIT/ML ~~LOC~~ SOPN
50.0000 [IU] | PEN_INJECTOR | Freq: Every day | SUBCUTANEOUS | 1 refills | Status: DC
Start: 2023-04-28 — End: 2023-11-08
  Filled 2023-11-08: qty 15, 90d supply, fill #0

## 2023-04-28 MED ORDER — KETOROLAC TROMETHAMINE 10 MG PO TABS: 10.0000 mg | ORAL_TABLET | Freq: Four times a day (QID) | ORAL | 0 refills | Status: DC | PRN

## 2023-04-28 NOTE — Discharge Instructions (Signed)
 You have been given a shot of Toradol 30 mg today.  Ketorolac 10 mg tablets--take 1 tablet every 6 hours as needed for pain.  This is the same medicine that is in the shot we just gave you  Ice and elevate your wrist.  We will notify you if there is any bony abnormality on your wrist x-ray.

## 2023-04-28 NOTE — Telephone Encounter (Signed)
 Pharmacy Patient Advocate Encounter  Received notification from [facility] that Prior Authorization for Dexcom G7 sensor has been APPROVED from 04/28/2023 to 04/27/2024. Ran test claim, Copay is $0.00. This test claim was processed through [facility]- copay amounts may vary at other pharmacies due to pharmacy/plan contracts, or as the patient moves through the different stages of their insurance plan.   PA #/Case ID/Reference #: [id number]

## 2023-04-28 NOTE — ED Provider Notes (Signed)
 [facility]    CSN: [id number] Arrival date & time: 04/28/23  0809      History   Chief Complaint Chief Complaint  Patient presents with   Wrist Pain    Entered by patient    HPI Carrie Dominguez is a 48 y.o. female.    Wrist Pain  Here for left wrist pain.  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  Since then it hurts a lot in her wrist and this morning her middle and ring finger are little numb.  No neck pain.  No fall or trauma to the wrist.  She has not take anything at home so far.  Past medical history significant for diabetes and her sugars have been in the 200s lately.  She has established with primary care and is working on getting restarted on some medications for her diabetes.  Last EGFR was 76.  She is on A new bump.  She is allergic to vancomycin, Cipro, and sulfa Past Medical History:  Diagnosis Date   HIV (human immunodeficiency virus infection) (HCC)    Obesity     Patient Active Problem List   Diagnosis Date Noted   Type 2 diabetes mellitus with hyperglycemia, without long-term current use of insulin (HCC) 04/21/2023   Abnormal cervical Papanicolaou smear 04/21/2023   HSV (herpes simplex virus) infection 04/21/2023   History of hysterectomy for benign disease 01/01/2015   Human immunodeficiency virus (HIV) disease (HCC) 12/01/2012   Anxiety 12/01/2012    Past Surgical History:  Procedure Laterality Date   ABDOMINAL HYSTERECTOMY  2014   TOOTH EXTRACTION      OB History     Gravida  1   Para      Term      Preterm      AB      Living         SAB      IAB      Ectopic      Multiple      Live Births               Home Medications    Prior to Admission medications   Medication Sig Start Date End Date Taking? Authorizing Provider  ketorolac (TORADOL) 10 MG tablet Take 1 tablet (10 mg total) by mouth every 6 (six) hours as needed (pain). 04/28/23  Yes  Zenia Resides, MD  atorvastatin (LIPITOR) 10 MG tablet Take 1 tablet (10 mg total) by mouth daily. 04/21/23   Alfredia Ferguson, PA-C  BD PEN NEEDLE NANO 2ND GEN 32G X 4 MM MISC Use w/ daily insulin injection 04/21/23   Drubel, Lillia Abed, PA-C  cabotegravir & rilpivirine ER (CABENUVA) 600 & 900 MG/3ML injection Inject into the muscle.    [provider]  Continuous Glucose Sensor (DEXCOM G7 SENSOR) MISC Use to continuously monitor blood sugar, replace every 10 days 04/21/23   Drubel, Lillia Abed, PA-C  Insulin Glargine (BASAGLAR KWIKPEN) 100 UNIT/ML Inject 50 Units into the skin daily. 04/21/23   Alfredia Ferguson, PA-C  lisinopril (ZESTRIL) 10 MG tablet Take 1 tablet (10 mg total) by mouth daily. 04/21/23   Alfredia Ferguson, PA-C  metFORMIN (GLUCOPHAGE-XR) 500 MG 24 hr tablet Take 1 tablet (500 mg total) by mouth daily with breakfast. 04/21/23 12/20/24  Drubel, Lillia Abed, PA-C  tirzepatide [facility]) 2.5 MG/0.5ML Pen Inject 2.5 mg into the skin once a week. 04/21/23   Alfredia Ferguson,  PA-C  tirzepatide (MOUNJARO) 5 MG/0.5ML Pen Inject 5 mg into the skin once a week. 05/12/23   Alfredia Ferguson, PA-C  valACYclovir (VALTREX) 1000 MG tablet Take 1 tablet (1,000 mg total) by mouth daily. 04/21/23   Alfredia Ferguson, PA-C    Family History History reviewed. No pertinent family history.  Social History Social History   Tobacco Use   Smoking status: Never   Smokeless tobacco: Never  Vaping Use   Vaping status: Never Used  Substance Use Topics   Alcohol use: No   Drug use: No     Allergies   Vancomycin, Ciprofloxacin hcl, Latex, Sulfa antibiotics, Bactrim, Ciprofloxacin hcl, and Sulfamethoxazole-trimethoprim   Review of Systems Review of Systems   Physical Exam Triage Vital Signs ED Triage Vitals [04/28/23 0821]  Encounter Vitals Group     BP (!) 147/87     Systolic BP Percentile      Diastolic BP Percentile      Pulse Rate 89     Resp 16     Temp 98.8 F (37.1 C)     Temp Source Oral      SpO2 94 %     Weight      Height      Head Circumference      Peak Flow      Pain Score      Pain Loc      Pain Education      Exclude from Growth Chart    No data found.  Updated Vital Signs BP (!) 147/87 (BP Location: Left Arm)   Pulse 89   Temp 98.8 F (37.1 C) (Oral)   Resp 16   LMP 11/06/2011   SpO2 94%   Visual Acuity Right Eye Distance:   Left Eye Distance:   Bilateral Distance:    Right Eye Near:   Left Eye Near:    Bilateral Near:     Physical Exam Vitals reviewed.  Constitutional:      General: She is not in acute distress.    Appearance: She is not ill-appearing, toxic-appearing or diaphoretic.  Musculoskeletal:     Comments: There is some tenderness over the dorsum of the left wrist and onto the hand.  Range of motion is normal of the fingers.  There is a little decreased sensation to light touch over the dorsum of the third and fourth fingers of the left hand.  Skin:    Coloration: Skin is not pale.  Neurological:     Mental Status: She is alert and oriented to person, place, and time.  Psychiatric:        Behavior: Behavior normal.      UC Treatments / Results  Labs (all labs ordered are listed, but only abnormal results are displayed) Labs Reviewed - No data to display  EKG   Radiology No results found.  Procedures Procedures (including critical care time)  Medications Ordered in UC Medications  ketorolac (TORADOL) 30 MG/ML injection 30 mg (has no administration in time range)    Initial Impression / Assessment and Plan / UC Course  I have reviewed the triage vital signs and the nursing notes.  Pertinent labs & imaging results that were available during my care of the patient were reviewed by me and considered in my medical decision making (see chart for details).      Final Clinical Impressions(s) / UC Diagnoses   Final diagnoses:  Left wrist pain     Discharge Instructions  You have been given a shot of  Toradol 30 mg today.  Ketorolac 10 mg tablets--take 1 tablet every 6 hours as needed for pain.  This is the same medicine that is in the shot we just gave you  Ice and elevate your wrist.  We will notify you if there is any bony abnormality on your wrist x-ray.     ED Prescriptions     Medication Sig Dispense Auth. Provider   ketorolac (TORADOL) 10 MG tablet Take 1 tablet (10 mg total) by mouth every 6 (six) hours as needed (pain). 20 tablet Kaysea Raya, Janace Aris, MD      PDMP not reviewed this encounter.   Zenia Resides, MD 04/28/23 [phone]

## 2023-04-28 NOTE — ED Triage Notes (Signed)
 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 Started this morning finger left hand felt numb. Pain is worse when she try to moves to the left.

## 2023-04-28 NOTE — Telephone Encounter (Signed)
Sent alternative in

## 2023-04-28 NOTE — Telephone Encounter (Signed)
PA has been approved, thank you!

## 2023-04-28 NOTE — Telephone Encounter (Signed)
 Pharmacy Patient Advocate Encounter   Received notification from Pt Calls Messages that prior authorization for BASAGLAR is required/requested.   Insurance verification completed.   The patient is insured through [facility] .   Per test claim:  SEMGLEE (YFGN), TOUJEO OR TRESIBA is preferred by the insurance.  If suggested medication is appropriate, Please send in a new RX and discontinue this one. If not, please advise as to why it's not appropriate so that we may request a Prior Authorization. Please note, some preferred medications may still require a PA.  If the suggested medications have not been trialed and there are no contraindications to their use, the PA will not be submitted, as it will not be approved.

## 2023-04-28 NOTE — Telephone Encounter (Signed)
 Pharmacy Patient Advocate Encounter   Received notification from CoverMyMeds that prior authorization for Dexcom G7 Sensor is required/requested.   Insurance verification completed.   The patient is insured through [facility] .   Per test claim: PA required; PA submitted to above mentioned insurance via CoverMyMeds Key/confirmation #/EOC [id number] Status is pending

## 2023-04-28 NOTE — Telephone Encounter (Signed)
 Copied from CRM [id number]. Topic: Clinical - Prescription Issue >> Apr 28, 2023 11:20 AM Carrie Dominguez wrote: Reason for CRM: Patient states insulin & Greggory Keen are both requiring prior authorizations, the pharmacy told her they have been reaching out since 3/13 & still haven't had Dominguez response.

## 2023-04-28 NOTE — Telephone Encounter (Signed)
 Pharmacy Patient Advocate Encounter  Received notification from [facility] that Prior Authorization for Carrie Dominguez has been APPROVED from 04/28/2023 to 04/27/2024. Ran test claim, Copay is $0.00. This test claim was processed through [facility]- copay amounts may vary at other pharmacies due to pharmacy/plan contracts, or as the patient moves through the different stages of their insurance plan.   PA #/Case ID/Reference #: [id number]

## 2023-04-28 NOTE — Telephone Encounter (Signed)
 PA requests have been Started. New Encounter has been or will be created for follow up. For additional info see Pharmacy Prior Auth telephones encounter from 04/28/23.

## 2023-04-28 NOTE — Telephone Encounter (Signed)
 Pharmacy Patient Advocate Encounter   Received notification from Pt Calls Messages that prior authorization for Mounjaro 2.5MG /0.5ML auto-injectors is required/requested.   Insurance verification completed.   The patient is insured through [facility] .   Per test claim: PA required; PA submitted to above mentioned insurance via CoverMyMeds Key/confirmation #/EOC [id number] Status is pending

## 2023-05-02 ENCOUNTER — Encounter: Payer: Self-pay | Admitting: Physician Assistant

## 2023-05-18 DIAGNOSIS — B20 Human immunodeficiency virus [HIV] disease: Secondary | ICD-10-CM | POA: Diagnosis not present

## 2023-05-18 DIAGNOSIS — Z79899 Other long term (current) drug therapy: Secondary | ICD-10-CM | POA: Diagnosis not present

## 2023-05-18 DIAGNOSIS — Z21 Asymptomatic human immunodeficiency virus [HIV] infection status: Secondary | ICD-10-CM | POA: Diagnosis not present

## 2023-06-01 ENCOUNTER — Other Ambulatory Visit (HOSPITAL_COMMUNITY): Payer: Self-pay

## 2023-06-01 ENCOUNTER — Other Ambulatory Visit: Payer: Self-pay

## 2023-06-01 MED ORDER — BASAGLAR KWIKPEN 100 UNIT/ML ~~LOC~~ SOPN
50.0000 [IU] | PEN_INJECTOR | Freq: Every day | SUBCUTANEOUS | 3 refills | Status: DC
  Filled 2023-06-21: qty 15, 30d supply, fill #0
  Filled 2023-08-24: qty 15, 30d supply, fill #1

## 2023-06-01 MED ORDER — METFORMIN HCL ER 500 MG PO TB24: 500.0000 mg | ORAL_TABLET | Freq: Every day | ORAL | 3 refills | Status: DC

## 2023-06-01 MED ORDER — LISINOPRIL 10 MG PO TABS
10.0000 mg | ORAL_TABLET | Freq: Every day | ORAL | 5 refills | Status: DC
  Filled 2023-06-06: qty 30, 30d supply, fill #0

## 2023-06-01 MED ORDER — DEXCOM G7 SENSOR MISC
3 refills | Status: DC
  Filled 2023-06-01: qty 5, 50d supply, fill #0

## 2023-06-01 MED ORDER — ATORVASTATIN CALCIUM 10 MG PO TABS
10.0000 mg | ORAL_TABLET | Freq: Every day | ORAL | 0 refills | Status: DC
  Filled 2023-06-06: qty 90, 90d supply, fill #0

## 2023-06-06 ENCOUNTER — Encounter: Payer: Self-pay | Admitting: Physician Assistant

## 2023-06-06 ENCOUNTER — Other Ambulatory Visit: Payer: Self-pay

## 2023-06-07 ENCOUNTER — Other Ambulatory Visit: Payer: Self-pay | Admitting: Physician Assistant

## 2023-06-07 ENCOUNTER — Ambulatory Visit
Admission: RE | Admit: 2023-06-07 | Discharge: 2023-06-07 | Disposition: A | Discharge status: Home / Self Care | Source: Ambulatory Visit | Attending: Family Medicine | Admitting: Family Medicine

## 2023-06-07 ENCOUNTER — Other Ambulatory Visit: Payer: Self-pay

## 2023-06-07 VITALS — BP 119/77 | HR 89 | Temp 98.2°F | Resp 16

## 2023-06-07 DIAGNOSIS — E1165 Type 2 diabetes mellitus with hyperglycemia: Secondary | ICD-10-CM

## 2023-06-07 DIAGNOSIS — J039 Acute tonsillitis, unspecified: Secondary | ICD-10-CM

## 2023-06-07 MED ORDER — AMOXICILLIN-POT CLAVULANATE 875-125 MG PO TABS
1.0000 | ORAL_TABLET | Freq: Two times a day (BID) | ORAL | 0 refills | Status: DC
Start: 2023-06-07 — End: 2023-09-23
  Filled 2023-06-07: qty 14, 7d supply, fill #0

## 2023-06-07 MED ORDER — PREDNISONE 20 MG PO TABS
40.0000 mg | ORAL_TABLET | Freq: Every day | ORAL | 0 refills | Status: AC
  Filled 2023-06-07: qty 10, 5d supply, fill #0

## 2023-06-07 NOTE — Discharge Instructions (Addendum)
 Start Augmentin twice daily for 7 days.  Take prednisone daily for 5 days.  You may continue Benadryl over-the-counter as needed.  Lots of rest and fluids.  Please follow-up with your PCP in 2 to 3 days for recheck.  Please go to the ER for any worsening symptoms.  I hope you feel better soon!

## 2023-06-07 NOTE — ED Provider Notes (Addendum)
 [facility]    CSN: [id number] Arrival date & time: 06/07/23  0847      History   Chief Complaint Chief Complaint  Patient presents with   Neck Pain   Ear Pain    HPI Carrie Dominguez is a 48 y.o. female  presents for evaluation of URI symptoms for 5 days.  Patient does have a past medical history of HIV.  Patient reports associated symptoms of right sided ear/neck/jaw pain/throat pain, right sided nasal congestion. Denies N/V/D, fevers, cough, body aches, shortness of breath. Patient does not have a hx of asthma.   Reports no sick contacts.  Pt has taken Benadryl and Toradol  OTC for symptoms. Pt has no other concerns at this time.    Neck Pain   Past Medical History:  Diagnosis Date   HIV (human immunodeficiency virus infection) (HCC)    Obesity     Patient Active Problem List   Diagnosis Date Noted   Type 2 diabetes mellitus with hyperglycemia, without long-term current use of insulin  (HCC) 04/21/2023   Abnormal cervical Papanicolaou smear 04/21/2023   HSV (herpes simplex virus) infection 04/21/2023   History of hysterectomy for benign disease 01/01/2015   Human immunodeficiency virus (HIV) disease (HCC) 12/01/2012   Anxiety 12/01/2012    Past Surgical History:  Procedure Laterality Date   ABDOMINAL HYSTERECTOMY  2014   TOOTH EXTRACTION      OB History     Gravida  1   Para      Term      Preterm      AB      Living         SAB      IAB      Ectopic      Multiple      Live Births               Home Medications    Prior to Admission medications   Medication Sig Start Date End Date Taking? Authorizing Provider  amoxicillin-clavulanate (AUGMENTIN) 875-125 MG tablet Take 1 tablet by mouth every 12 (twelve) hours. 06/07/23  Yes Elmo Shumard, Jodi R, NP  predniSONE (DELTASONE) 20 MG tablet Take 2 tablets (40 mg total) by mouth daily with breakfast for 5 days. 06/07/23 06/12/23 Yes Merikay Lesniewski, Jodi R, NP  atorvastatin  (LIPITOR) 10 MG tablet  Take 1 tablet (10 mg total) by mouth daily. 04/21/23   Trenton Frock, PA-C  atorvastatin  (LIPITOR) 10 MG tablet Take 1 tablet (10 mg total) by mouth daily. 11/15/22     BD PEN NEEDLE NANO 2ND GEN 32G X 4 MM MISC Use w/ daily insulin  injection 04/21/23   Drubel, Heidi Llamas, PA-C  cabotegravir & rilpivirine ER (CABENUVA) 600 & 900 MG/3ML injection Inject into the muscle.    [provider]  Continuous Glucose Sensor (DEXCOM G7 SENSOR) MISC Use to continuously monitor blood sugar, replace every 10 days 04/21/23   Drubel, Heidi Llamas, PA-C  Continuous Glucose Sensor (DEXCOM G7 SENSOR) MISC Use to continuously monitor blood sugar, replace every 10 days. 11/15/22     Insulin  Glargine (BASAGLAR  KWIKPEN) 100 UNIT/ML Inject 50 Units into the skin daily. 04/21/23   Trenton Frock, PA-C  insulin  glargine, 1 Unit Dial , (TOUJEO ) 300 UNIT/ML Solostar Pen Inject 50 Units into the skin daily. 04/28/23   Trenton Frock, PA-C  ketorolac  (TORADOL ) 10 MG tablet Take 1 tablet (10 mg total) by mouth every 6 (six) hours as needed (pain). 04/28/23   Ann Keto, MD  lisinopril  (  ZESTRIL ) 10 MG tablet Take 1 tablet (10 mg total) by mouth daily. 04/21/23   Trenton Frock, PA-C  lisinopril  (ZESTRIL ) 10 MG tablet Take 1 tablet (10 mg total) by mouth daily. 11/15/22     metFORMIN  (GLUCOPHAGE -XR) 500 MG 24 hr tablet Take 1 tablet (500 mg total) by mouth daily with breakfast. 04/21/23 12/20/24  Drubel, Heidi Llamas, PA-C  metFORMIN  (GLUCOPHAGE -XR) 500 MG 24 hr tablet Take 1 tablet (500 mg total) by mouth daily with breakfast. 11/15/22     tirzepatide (MOUNJARO) 2.5 MG/0.5ML Pen Inject 2.5 mg into the skin once a week. 04/21/23   Drubel, Heidi Llamas, PA-C  tirzepatide [facility]) 5 MG/0.5ML Pen Inject 5 mg into the skin once a week. 05/12/23   Trenton Frock, PA-C  valACYclovir  (VALTREX ) 1000 MG tablet Take 1 tablet (1,000 mg total) by mouth daily. 04/21/23   Trenton Frock, PA-C    Family History History reviewed. No pertinent family  history.  Social History Social History   Tobacco Use   Smoking status: Never   Smokeless tobacco: Never  Vaping Use   Vaping status: Never Used  Substance Use Topics   Alcohol use: No   Drug use: No     Allergies   Vancomycin, Ciprofloxacin hcl, Latex, Sulfa antibiotics, Bactrim, Ciprofloxacin hcl, and Sulfamethoxazole-trimethoprim   Review of Systems Review of Systems  HENT:  Positive for congestion, ear pain and sore throat.   Musculoskeletal:  Positive for neck pain.     Physical Exam Triage Vital Signs ED Triage Vitals  Encounter Vitals Group     BP 06/07/23 0902 119/77     Systolic BP Percentile --      Diastolic BP Percentile --      Pulse Rate 06/07/23 0902 89     Resp 06/07/23 0902 16     Temp 06/07/23 0904 98.2 F (36.8 C)     Temp Source 06/07/23 0902 Oral     SpO2 06/07/23 0902 96 %     Weight --      Height --      Head Circumference --      Peak Flow --      Pain Score 06/07/23 0901 5     Pain Loc --      Pain Education --      Exclude from Growth Chart --    No data found.  Updated Vital Signs BP 119/77 (BP Location: Left Arm)   Pulse 89   Temp 98.2 F (36.8 C)   Resp 16   LMP 11/06/2011   SpO2 96%   Visual Acuity Right Eye Distance:   Left Eye Distance:   Bilateral Distance:    Right Eye Near:   Left Eye Near:    Bilateral Near:     Physical Exam Vitals and nursing note reviewed.  Constitutional:      General: She is not in acute distress.    Appearance: She is well-developed. She is not ill-appearing.  HENT:     Head: Normocephalic and atraumatic.     Right Ear: Tympanic membrane and ear canal normal.     Left Ear: Tympanic membrane and ear canal normal.     Nose: Congestion present.     Mouth/Throat:     Mouth: Mucous membranes are moist.     Pharynx: Oropharynx is clear. Uvula midline. No posterior oropharyngeal erythema.     Tonsils: No tonsillar exudate or tonsillar abscesses. 3+ on the right.  Eyes:      Conjunctiva/sclera: Conjunctivae  normal.     Pupils: Pupils are equal, round, and reactive to light.  Neck:     Comments: Right sided adenopathy Cardiovascular:     Rate and Rhythm: Normal rate.  Pulmonary:     Effort: Pulmonary effort is normal.  Musculoskeletal:     Cervical back: Normal range of motion and neck supple.  Lymphadenopathy:     Cervical: Cervical adenopathy present.  Skin:    General: Skin is warm and dry.  Neurological:     General: No focal deficit present.     Mental Status: She is alert and oriented to person, place, and time.  Psychiatric:        Mood and Affect: Mood normal.        Behavior: Behavior normal.      UC Treatments / Results  Labs (all labs ordered are listed, but only abnormal results are displayed) Labs Reviewed - No data to display  Comprehensive Metabolic Panel Order: [id number] Component Ref Range & Units 2 wk ago  Sodium 136 - 145 mmol/L 141  Potassium 3.5 - 5.1 mmol/L 3.8  Comment: NO VISIBLE HEMOLYSIS  Chloride 98 - 107 mmol/L 104  CO2 21 - 31 mmol/L 28  Anion Gap 6 - 14 mmol/L 9  Glucose, Random 70 - 99 mg/dL 409 High   Blood Urea Nitrogen (BUN) 7 - 25 mg/dL 9  Creatinine 8.11 - 9.14 mg/dL 7.82  eGFR >95 AO/ZHY/8.65H8 >90  Comment: GFR estimated by CKD-EPI equations(NKF 2021).  "Recommend confirmation of Cr-based eGFR by using Cys-based eGFR and other filtration markers (if applicable) in complex cases and clinical decision-making, as needed."  Albumin 3.5 - 5.7 g/dL 4  Total Protein 6.4 - 8.9 g/dL 6.6  Bilirubin, Total 0.3 - 1.0 mg/dL 0.3  Alkaline Phosphatase (ALP) 34 - 104 U/L 40  Aspartate Aminotransferase (AST) 13 - 39 U/L 12 Low   Alanine Aminotransferase (ALT) 7 - 52 U/L 14  Calcium  8.6 - 10.3 mg/dL 8.5 Low   BUN/Creatinine Ratio   Comment: Creatinine is normal, ratio is not clinically indicated.  Resulting Agency [facility](CLIA# [id number])   Specimen Collected:  05/18/23 11:19   Performed by: Rosalva Comber [facility](CLIA# [id number]) Last Resulted: 05/18/23 12:16  Received From: [facility]  Result Received: 06/07/23 07:28    EKG   Radiology No results found.  Procedures Procedures (including critical care time)  Medications Ordered in UC Medications - No data to display  Initial Impression / Assessment and Plan / UC Course  I have reviewed the triage vital signs and the nursing notes.  Pertinent labs & imaging results that were available during my care of the patient were reviewed by me and considered in my medical decision making (see chart for details).     Reviewed exam and symptoms with patient.  No red flags.  Will start Augmentin and prednisone for tonsillitis.  May continue Benadryl as needed.  OTC analgesics as needed.  Advised PCP follow-up 2 to 3 days for recheck.  ER precautions reviewed and patient verbalized understanding. Final Clinical Impressions(s) / UC Diagnoses   Final diagnoses:  Acute tonsillitis, unspecified etiology     Discharge Instructions      Start Augmentin twice daily for 7 days.  Take prednisone daily for 5 days.  You may continue Benadryl over-the-counter as needed.  Lots of rest and fluids.  Please follow-up with your PCP in 2 to 3 days for recheck.  Please go  to the ER for any worsening symptoms.  I hope you feel better soon!   ED Prescriptions     Medication Sig Dispense Auth. Provider   amoxicillin-clavulanate (AUGMENTIN) 875-125 MG tablet Take 1 tablet by mouth every 12 (twelve) hours. 14 tablet Deerica Waszak, Jodi R, NP   predniSONE (DELTASONE) 20 MG tablet Take 2 tablets (40 mg total) by mouth daily with breakfast for 5 days. 10 tablet Chandel Zaun, Jodi R, NP      PDMP not reviewed this encounter.   Alleen Arbour, NP 06/07/23 1610    Alleen Arbour, NP 06/07/23 9604    Alleen Arbour, NP 06/07/23 [phone]

## 2023-06-07 NOTE — ED Triage Notes (Signed)
 Pt present with c/o rt side neck pain x 5 days. Pt states her ear and jaw hurt.  Home interventions: benadryl and excedrin

## 2023-06-09 ENCOUNTER — Other Ambulatory Visit: Payer: Self-pay

## 2023-06-21 ENCOUNTER — Other Ambulatory Visit: Payer: Self-pay

## 2023-06-22 DIAGNOSIS — H52223 Regular astigmatism, bilateral: Secondary | ICD-10-CM | POA: Diagnosis not present

## 2023-06-22 DIAGNOSIS — Z794 Long term (current) use of insulin: Secondary | ICD-10-CM | POA: Diagnosis not present

## 2023-06-22 DIAGNOSIS — H5201 Hypermetropia, right eye: Secondary | ICD-10-CM | POA: Diagnosis not present

## 2023-06-22 DIAGNOSIS — H524 Presbyopia: Secondary | ICD-10-CM | POA: Diagnosis not present

## 2023-06-22 DIAGNOSIS — E1165 Type 2 diabetes mellitus with hyperglycemia: Secondary | ICD-10-CM | POA: Diagnosis not present

## 2023-07-13 ENCOUNTER — Other Ambulatory Visit: Payer: Self-pay

## 2023-07-14 DIAGNOSIS — Z21 Asymptomatic human immunodeficiency virus [HIV] infection status: Secondary | ICD-10-CM | POA: Diagnosis not present

## 2023-07-21 NOTE — Progress Notes (Signed)
 Established patient visit   Patient: Carrie Dominguez   DOB: 07/10/1975   48 y.o. Female  MRN: [id number] Visit Date: 07/22/2023  Today's healthcare provider: Trenton Frock, PA-C   Chief Complaint  Patient presents with   Follow-up    Doing okay; no new concerns   Subjective    Diabetes Mellitus Type II, Follow-up  Lab Results  Component Value Date   HGBA1C 9.7 (H) 04/21/2023   Wt Readings from Last 3 Encounters:  07/22/23 230 lb (104.3 kg)  04/21/23 239 lb (108.4 kg)  10/25/17 245 lb (111.1 kg)   Home blood sugar records: average 150-160, 79% in range.  Episodes of hypoglycemia? No -- dexom sometimes reads 50-60s at night but no hypoglycemic symptoms. Pt does not have poc glucose device to confirm low.  Current insulin  regiment: 50U  Pertinent Labs: Lab Results  Component Value Date   CHOL 182 04/21/2023   HDL 39.80 04/21/2023   LDLCALC 118 (H) 04/21/2023   TRIG 120.0 04/21/2023   CHOLHDL 5 04/21/2023   Lab Results  Component Value Date   NA 137 04/21/2023   K 4.5 04/21/2023   CREATININE 0.90 04/21/2023   GFRNONAA >60 09/17/2017     --------------------------------------------------------------------------------------------------- Discussed the use of AI scribe software for clinical note transcription with the patient, who gave verbal consent to proceed.  History of Present Illness    She jammed her right pinky finger a month ago, resulting in pain and swelling, especially when bending. Epsom salt soaks have been used, but discomfort and recurrent jamming persist.     Medications: Outpatient Medications Prior to Visit  Medication Sig   amoxicillin -clavulanate (AUGMENTIN ) 875-125 MG tablet Take 1 tablet by mouth every 12 (twelve) hours.   atorvastatin  (LIPITOR) 10 MG tablet Take 1 tablet (10 mg total) by mouth daily.   BD PEN NEEDLE NANO 2ND GEN 32G X 4 MM MISC Use w/ daily insulin  injection   cabotegravir & rilpivirine ER (CABENUVA) 600 & 900  MG/3ML injection Inject into the muscle.   Continuous Glucose Sensor (DEXCOM G7 SENSOR) MISC Use to continuously monitor blood sugar, replace every 10 days   Insulin  Glargine (BASAGLAR  KWIKPEN) 100 UNIT/ML Inject 50 Units into the skin daily.   insulin  glargine, 1 Unit Dial , (TOUJEO ) 300 UNIT/ML Solostar Pen Inject 50 Units into the skin daily.   ketorolac  (TORADOL ) 10 MG tablet Take 1 tablet (10 mg total) by mouth every 6 (six) hours as needed (pain).   lisinopril  (ZESTRIL ) 10 MG tablet Take 1 tablet (10 mg total) by mouth daily.   metFORMIN  (GLUCOPHAGE -XR) 500 MG 24 hr tablet Take 1 tablet (500 mg total) by mouth daily with breakfast.   valACYclovir  (VALTREX ) 1000 MG tablet Take 1 tablet (1,000 mg total) by mouth daily.   [DISCONTINUED] tirzepatide  (MOUNJARO ) 5 MG/0.5ML Pen Inject 5 mg into the skin once a week.   [DISCONTINUED] atorvastatin  (LIPITOR) 10 MG tablet Take 1 tablet (10 mg total) by mouth daily.   [DISCONTINUED] Continuous Glucose Sensor (DEXCOM G7 SENSOR) MISC Use to continuously monitor blood sugar, replace every 10 days.   [DISCONTINUED] lisinopril  (ZESTRIL ) 10 MG tablet Take 1 tablet (10 mg total) by mouth daily.   [DISCONTINUED] metFORMIN  (GLUCOPHAGE -XR) 500 MG 24 hr tablet Take 1 tablet (500 mg total) by mouth daily with breakfast.   [DISCONTINUED] tirzepatide  (MOUNJARO ) 2.5 MG/0.5ML Pen Inject 2.5 mg into the skin once a week.   No facility-administered medications prior to visit.    Review of Systems  Objective    BP 117/77   Pulse 96   Temp 98 F (36.7 C)   Ht 5' 4 (1.626 m)   Wt 230 lb (104.3 kg)   LMP 11/06/2011   SpO2 98%   BMI 39.48 kg/m    Physical Exam Vitals reviewed.  Constitutional:      Appearance: She is not ill-appearing.  HENT:     Head: Normocephalic.   Eyes:     Conjunctiva/sclera: Conjunctivae normal.    Cardiovascular:     Rate and Rhythm: Normal rate.     Pulses:          Dorsalis pedis pulses are 3+ on the right side and  3+ on the left side.       Posterior tibial pulses are 3+ on the right side and 3+ on the left side.  Pulmonary:     Effort: Pulmonary effort is normal. No respiratory distress.   Musculoskeletal:     Comments: Mild edema right 5th finger DIP with tenderness. Limited ROM 5th finger  Feet:     Right foot:     Protective Sensation: 4 sites tested.  4 sites sensed.     Skin integrity: Skin integrity normal.     Toenail Condition: Right toenails are normal.     Left foot:     Protective Sensation: 4 sites tested.  4 sites sensed.     Skin integrity: Skin integrity normal.     Toenail Condition: Left toenails are normal.   Neurological:     Mental Status: She is alert and oriented to person, place, and time.   Psychiatric:        Mood and Affect: Mood normal.        Behavior: Behavior normal.     No results found for any visits on 07/22/23.  Assessment & Plan    Type 2 diabetes mellitus with hyperglycemia, without long-term current use of insulin  (HCC) Assessment & Plan: Review of dexcom, average sugars 150-160s. 79% of the time in range.  Managed with lantus  50 U, metformin  500 mg xr, mounjaro  5 mg. Plan to titrate mounjaro  up and decrease lantus . Advised appropriate parameters of decreasing lantus , by 2 U q 2-3 days, if fasting BS < 130.  Sending in mounjaro  7.5 mg.  Happy over improvement in numbers. Repeat A1c, lipids, cmp, uacr.  On statin, acei.  Pt prefers endo, last referral looks like was not completed? Re-sending. Orders: -     Hemoglobin A1c -     Basic metabolic panel with GFR -     CBC with Differential/Platelet -     Lipid panel -     Ambulatory referral to Endocrinology -     Blood Glucose Monitoring Suppl; 1 each by Does not apply route in the morning, at noon, and at bedtime. May substitute to any manufacturer covered by patient's insurance.  Dispense: 1 each; Refill: 0 -     Blood Glucose Test; 1 each by In Vitro route in the morning, at noon, and at  bedtime. May substitute to any manufacturer covered by patient's insurance.  Dispense: 100 strip; Refill: 0 -     Lancet Device; 1 each by Does not apply route in the morning, at noon, and at bedtime. May substitute to any manufacturer covered by patient's insurance.  Dispense: 1 each; Refill: 0 -     Lancets Misc.; 1 each by Does not apply route in the morning, at noon, and at bedtime. May substitute to  any manufacturer covered by [employer].  Dispense: 100 each; Refill: 0 -     Tirzepatide ; Inject 7.5 mg into the skin once a week.  Dispense: 2 mL; Refill: 3 -     Microalbumin / creatinine urine ratio  Finger pain, right Does not feel misaligned or dislocated.  Given persistent pain, ordering xray. Recommending finger splint -     DG Hand Complete Right; Future  Return in about 4 months (around 11/21/2023) for DMII.       Trenton Frock, PA-C  [facility] [phone] (phone) [phone] (fax)  [facility]

## 2023-07-22 ENCOUNTER — Telehealth: Payer: Self-pay | Admitting: Physician Assistant

## 2023-07-22 ENCOUNTER — Ambulatory Visit (HOSPITAL_BASED_OUTPATIENT_CLINIC_OR_DEPARTMENT_OTHER)
Admission: RE | Admit: 2023-07-22 | Discharge: 2023-07-22 | Disposition: A | Discharge status: Home / Self Care | Source: Ambulatory Visit | Attending: Physician Assistant

## 2023-07-22 ENCOUNTER — Encounter: Payer: Self-pay | Admitting: Physician Assistant

## 2023-07-22 ENCOUNTER — Ambulatory Visit (INDEPENDENT_AMBULATORY_CARE_PROVIDER_SITE_OTHER): Admitting: Physician Assistant

## 2023-07-22 VITALS — BP 117/77 | HR 96 | Temp 98.0°F | Ht 64.0 in | Wt 230.0 lb

## 2023-07-22 DIAGNOSIS — E1165 Type 2 diabetes mellitus with hyperglycemia: Secondary | ICD-10-CM

## 2023-07-22 DIAGNOSIS — M79641 Pain in right hand: Secondary | ICD-10-CM | POA: Diagnosis not present

## 2023-07-22 DIAGNOSIS — S62666A Nondisplaced fracture of distal phalanx of right little finger, initial encounter for closed fracture: Secondary | ICD-10-CM | POA: Diagnosis not present

## 2023-07-22 DIAGNOSIS — Z7985 Long-term (current) use of injectable non-insulin antidiabetic drugs: Secondary | ICD-10-CM

## 2023-07-22 DIAGNOSIS — M79644 Pain in right finger(s): Secondary | ICD-10-CM | POA: Diagnosis not present

## 2023-07-22 DIAGNOSIS — Z7984 Long term (current) use of oral hypoglycemic drugs: Secondary | ICD-10-CM

## 2023-07-22 MED ORDER — BLOOD GLUCOSE TEST VI STRP: 1.0000 | ORAL_STRIP | Freq: Three times a day (TID) | 0 refills | Status: DC

## 2023-07-22 MED ORDER — TIRZEPATIDE 7.5 MG/0.5ML ~~LOC~~ SOAJ: 7.5000 mg | SUBCUTANEOUS | 3 refills | Status: DC

## 2023-07-22 MED ORDER — BLOOD GLUCOSE MONITORING SUPPL DEVI: 1.0000 | Freq: Three times a day (TID) | 0 refills | Status: DC

## 2023-07-22 MED ORDER — LANCETS MISC. MISC: 1.0000 | Freq: Three times a day (TID) | 0 refills | Status: DC

## 2023-07-22 MED ORDER — LANCET DEVICE MISC: 1.0000 | Freq: Three times a day (TID) | 0 refills | Status: DC

## 2023-07-22 NOTE — Assessment & Plan Note (Signed)
 Review of dexcom, average sugars 150-160s. 79% of the time in range.  Managed with lantus  50 U, metformin  500 mg xr, mounjaro  5 mg. Plan to titrate mounjaro  up and decrease lantus . Advised appropriate parameters of decreasing lantus , by 2 U q 2-3 days, if fasting BS < 130.  Sending in mounjaro  7.5 mg.  Happy over improvement in numbers. Repeat A1c, lipids, cmp, uacr.  On statin, acei.

## 2023-07-22 NOTE — Telephone Encounter (Signed)
 6/13 pt needs meds sent to [facility] instead of [facility]

## 2023-07-23 LAB — BASIC METABOLIC PANEL WITH GFR
BUN: 10 mg/dL (ref 7–25)
CO2: 22 mmol/L (ref 20–32)
Calcium: 8.4 mg/dL — ABNORMAL LOW (ref 8.6–10.2)
Chloride: 108 mmol/L (ref 98–110)
Creat: 0.81 mg/dL (ref 0.50–0.99)
Glucose, Bld: 135 mg/dL — ABNORMAL HIGH (ref 65–99)
Potassium: 4.3 mmol/L (ref 3.5–5.3)
Sodium: 139 mmol/L (ref 135–146)
eGFR: 90 mL/min/1.73m2

## 2023-07-23 LAB — CBC WITH DIFFERENTIAL/PLATELET
Absolute Lymphocytes: 2294 {cells}/uL (ref 850–3900)
Absolute Monocytes: 397 {cells}/uL (ref 200–950)
Basophils Absolute: 0 {cells}/uL (ref 0–200)
Basophils Relative: 0 %
Eosinophils Absolute: 122 {cells}/uL (ref 15–500)
Eosinophils Relative: 2 %
HCT: 40.6 % (ref 35.0–45.0)
Hemoglobin: 13 g/dL (ref 11.7–15.5)
MCH: 30.2 pg (ref 27.0–33.0)
MCHC: 32 g/dL (ref 32.0–36.0)
MCV: 94.4 fL (ref 80.0–100.0)
MPV: 11.3 fL (ref 7.5–12.5)
Monocytes Relative: 6.5 %
Neutro Abs: 3288 {cells}/uL (ref 1500–7800)
Neutrophils Relative %: 53.9 %
Platelets: 168 10*3/uL (ref 140–400)
RBC: 4.3 10*6/uL (ref 3.80–5.10)
RDW: 12.8 % (ref 11.0–15.0)
Total Lymphocyte: 37.6 %
WBC: 6.1 10*3/uL (ref 3.8–10.8)

## 2023-07-23 LAB — LIPID PANEL
Cholesterol: 139 mg/dL (ref <200)
HDL: 41 mg/dL — ABNORMAL LOW (ref >=50)
LDL Cholesterol (Calc): 80 mg/dL
Non-HDL Cholesterol (Calc): 98 mg/dL (ref <130)
Total CHOL/HDL Ratio: 3.4 (calc) (ref <5.0)
Triglycerides: 96 mg/dL (ref <150)

## 2023-07-23 LAB — MICROALBUMIN / CREATININE URINE RATIO
Creatinine, Urine: 251 mg/dL (ref 20–275)
Microalb Creat Ratio: 9 mg/g{creat} (ref <30)
Microalb, Ur: 2.3 mg/dL

## 2023-07-23 LAB — HEMOGLOBIN A1C
Hgb A1c MFr Bld: 8.1 % — ABNORMAL HIGH
Mean Plasma Glucose: 186 mg/dL
eAG (mmol/L): 10.3 mmol/L

## 2023-07-25 ENCOUNTER — Ambulatory Visit: Payer: Self-pay | Admitting: Physician Assistant

## 2023-07-25 ENCOUNTER — Other Ambulatory Visit: Payer: Self-pay | Admitting: *Deleted

## 2023-07-25 ENCOUNTER — Other Ambulatory Visit: Payer: Self-pay

## 2023-07-25 DIAGNOSIS — E1165 Type 2 diabetes mellitus with hyperglycemia: Secondary | ICD-10-CM

## 2023-07-25 DIAGNOSIS — S62667A Nondisplaced fracture of distal phalanx of left little finger, initial encounter for closed fracture: Secondary | ICD-10-CM

## 2023-07-25 MED ORDER — LANCET DEVICE MISC
1.0000 | Freq: Three times a day (TID) | 0 refills | Status: AC
  Filled 2023-07-25 – 2023-08-01 (×2): qty 1, 30d supply, fill #0

## 2023-07-25 MED ORDER — BLOOD GLUCOSE TEST VI STRP
1.0000 | ORAL_STRIP | Freq: Three times a day (TID) | 0 refills | Status: DC
  Filled 2023-07-25: qty 100, 34d supply, fill #0

## 2023-07-25 MED ORDER — BLOOD GLUCOSE MONITOR SYSTEM W/DEVICE KIT
1.0000 | PACK | Freq: Three times a day (TID) | 0 refills | Status: DC
  Filled 2023-07-25: qty 1, 30d supply, fill #0

## 2023-07-25 MED ORDER — ONETOUCH DELICA PLUS LANCET33G MISC
1.0000 | Freq: Three times a day (TID) | 0 refills | Status: AC
  Filled 2023-07-25 – 2023-08-01 (×2): qty 100, 34d supply, fill #0
  Filled 2023-08-03: qty 100, 30d supply, fill #0

## 2023-07-25 MED ORDER — TIRZEPATIDE 7.5 MG/0.5ML ~~LOC~~ SOAJ
7.5000 mg | SUBCUTANEOUS | 3 refills | Status: DC
  Filled 2023-07-25: qty 2, 28d supply, fill #0
  Filled 2023-08-24: qty 2, 28d supply, fill #1

## 2023-07-25 NOTE — Telephone Encounter (Signed)
 Sent!

## 2023-08-01 ENCOUNTER — Other Ambulatory Visit: Payer: Self-pay

## 2023-08-03 ENCOUNTER — Other Ambulatory Visit: Payer: Self-pay

## 2023-08-24 ENCOUNTER — Other Ambulatory Visit: Payer: Self-pay

## 2023-09-08 DIAGNOSIS — Z21 Asymptomatic human immunodeficiency virus [HIV] infection status: Secondary | ICD-10-CM | POA: Diagnosis not present

## 2023-09-23 ENCOUNTER — Other Ambulatory Visit: Payer: Self-pay

## 2023-09-23 ENCOUNTER — Ambulatory Visit: Admitting: Family

## 2023-09-23 ENCOUNTER — Encounter: Payer: Self-pay | Admitting: Family

## 2023-09-23 VITALS — BP 134/88 | HR 84 | Ht 64.0 in | Wt 232.4 lb

## 2023-09-23 DIAGNOSIS — Z0000 Encounter for general adult medical examination without abnormal findings: Secondary | ICD-10-CM

## 2023-09-23 DIAGNOSIS — R87629 Unspecified abnormal cytological findings in specimens from vagina: Secondary | ICD-10-CM

## 2023-09-23 DIAGNOSIS — E1165 Type 2 diabetes mellitus with hyperglycemia: Secondary | ICD-10-CM | POA: Diagnosis not present

## 2023-09-23 MED ORDER — LISINOPRIL 10 MG PO TABS
10.0000 mg | ORAL_TABLET | Freq: Every day | ORAL | 3 refills | Status: AC
  Filled 2023-09-23 – 2023-11-08 (×2): qty 90, 90d supply, fill #0
  Filled 2024-02-07: qty 90, 90d supply, fill #1

## 2023-09-23 MED ORDER — ATORVASTATIN CALCIUM 10 MG PO TABS
10.0000 mg | ORAL_TABLET | Freq: Every day | ORAL | 3 refills | Status: AC
  Filled 2023-09-23 – 2023-11-08 (×2): qty 90, 90d supply, fill #0
  Filled 2024-02-07: qty 90, 90d supply, fill #1

## 2023-09-23 NOTE — Progress Notes (Signed)
 Carrie Dominguez is a 48 y.o. female with the following history as recorded in [employer]:  Patient Active Problem List   Diagnosis Date Noted   Type 2 diabetes mellitus with hyperglycemia, without long-term current use of insulin  (HCC) 04/21/2023   Abnormal cervical Papanicolaou smear 04/21/2023   HSV (herpes simplex virus) infection 04/21/2023   History of hysterectomy for benign disease 01/01/2015   Human immunodeficiency virus (HIV) disease (HCC) 12/01/2012   Anxiety 12/01/2012    Current Outpatient Medications  Medication Sig Dispense Refill   BD PEN NEEDLE NANO 2ND GEN 32G X 4 MM MISC Use w/ daily insulin  injection 100 each 2   Blood Glucose Monitoring Suppl (BLOOD GLUCOSE MONITOR SYSTEM) w/Device KIT 1 each by Does not apply route in the morning, at noon, and at bedtime. May substitute to any manufacturer covered by patient's insurance. 1 kit 0   cabotegravir & rilpivirine ER (CABENUVA) 600 & 900 MG/3ML injection Inject into the muscle.     Continuous Glucose Sensor (DEXCOM G7 SENSOR) MISC Use to continuously monitor blood sugar, replace every 10 days 3 each 3   insulin  glargine, 1 Unit Dial , (TOUJEO ) 300 UNIT/ML Solostar Pen Inject 50 Units into the skin daily. 15 mL 1   metFORMIN  (GLUCOPHAGE -XR) 500 MG 24 hr tablet Take 1 tablet (500 mg total) by mouth daily with breakfast. 90 tablet 1   tirzepatide  (MOUNJARO ) 7.5 MG/0.5ML Pen Inject 7.5 mg into the skin once a week. 2 mL 3   valACYclovir  (VALTREX ) 1000 MG tablet Take 1 tablet (1,000 mg total) by mouth daily. 90 tablet 1   atorvastatin  (LIPITOR) 10 MG tablet Take 1 tablet (10 mg total) by mouth daily. 90 tablet 3   lisinopril  (ZESTRIL ) 10 MG tablet Take 1 tablet (10 mg total) by mouth daily. 90 tablet 3   No current facility-administered medications for this visit.    Allergies: Vancomycin, Ciprofloxacin hcl, Latex, Sulfa antibiotics, Bactrim, Prochlorperazine, Ciprofloxacin hcl, and Sulfamethoxazole-trimethoprim  Past Medical  History:  Diagnosis Date   HIV (human immunodeficiency virus infection) (HCC)    Obesity     Past Surgical History:  Procedure Laterality Date   ABDOMINAL HYSTERECTOMY  2014   TOOTH EXTRACTION      No family history on file.  Social History   Tobacco Use   Smoking status: Never   Smokeless tobacco: Never  Substance Use Topics   Alcohol use: No    Subjective:  Patient's PCP in our office will be leaving later this month; she needs CPE updated for her employer;  Up to date on mammogram and colonoscopy; needs to get established with GYN- notes is s/p hysterectomy but abnormal cells found on last pap smear and colposcopy was recommended;   Will continue with her ID provider;  Planning to establish with endocrine next month for management of diabetes- is having diarrhea with Mounjaro  but does wants to wait and discuss regimen changes with endocrine;   Review of Systems  Constitutional: Negative.   HENT: Negative.    Eyes: Negative.   Respiratory: Negative.    Cardiovascular: Negative.   Gastrointestinal: Negative.   Genitourinary: Negative.   Musculoskeletal: Negative.   Skin: Negative.   Neurological: Negative.   Endo/Heme/Allergies: Negative.   Psychiatric/Behavioral: Negative.        Objective:  Vitals:   09/23/23 1038  BP: 134/88  Pulse: 84  SpO2: 95%  Weight: 232 lb 6.4 oz (105.4 kg)  Height: 5' 4 (1.626 m)    General: Well developed, well nourished,  in no acute distress  Skin : Warm and dry.  Head: Normocephalic and atraumatic  Eyes: Sclera and conjunctiva clear; pupils round and reactive to light; extraocular movements intact  Ears: External normal; canals clear; tympanic membranes normal  Oropharynx: Pink, supple. No suspicious lesions  Neck: Supple without thyromegaly, adenopathy  Lungs: Respirations unlabored; clear to auscultation bilaterally without wheeze, rales, rhonchi  CVS exam: normal rate and regular rhythm.  Abdomen: Soft; nontender;  nondistended; normoactive bowel sounds; no masses or hepatosplenomegaly  Musculoskeletal: No deformities; no active joint inflammation  Extremities: No edema, cyanosis, clubbing  Vessels: Symmetric bilaterally  Neurologic: Alert and oriented; speech intact; face symmetrical; moves all extremities well; CNII-XII intact without focal deficit   Assessment:  1. PE (physical exam), annual   2. Type 2 diabetes mellitus with hyperglycemia, without long-term current use of insulin  (HCC)   3. Abnormal Pap smear of vagina     Plan:  Age appropriate preventive healthcare needs addressed; encouraged regular eye doctor and dental exams; encouraged regular exercise; will update labs and refills as needed today; follow-up to be determined; Referral to GYN and keep planned follow up with endocrinology;   Return in about 9 months (around 06/22/2024) for CPE.  Orders Placed This Encounter  Procedures   Ambulatory referral to Obstetrics / Gynecology    Referral Priority:   Routine    Referral Type:   Consultation    Referral Reason:   Specialty Services Required    Requested Specialty:   Obstetrics and Gynecology    Number of Visits Requested:   1    Requested Prescriptions   Signed Prescriptions Disp Refills   atorvastatin  (LIPITOR) 10 MG tablet 90 tablet 3    Sig: Take 1 tablet (10 mg total) by mouth daily.   lisinopril  (ZESTRIL ) 10 MG tablet 90 tablet 3    Sig: Take 1 tablet (10 mg total) by mouth daily.

## 2023-10-04 ENCOUNTER — Other Ambulatory Visit: Payer: Self-pay

## 2023-10-04 ENCOUNTER — Other Ambulatory Visit: Payer: Self-pay | Admitting: Family

## 2023-10-04 DIAGNOSIS — E1165 Type 2 diabetes mellitus with hyperglycemia: Secondary | ICD-10-CM

## 2023-10-04 MED ORDER — BLOOD GLUCOSE TEST VI STRP
1.0000 | ORAL_STRIP | Freq: Three times a day (TID) | 0 refills | Status: AC
  Filled 2023-10-04: qty 100, 34d supply, fill #0

## 2023-10-27 ENCOUNTER — Telehealth: Payer: Self-pay

## 2023-10-27 NOTE — Telephone Encounter (Signed)
 Called patient to schedule new patient appointment. Left voicemail with our contact information to call back and schedule.

## 2023-11-03 DIAGNOSIS — Z21 Asymptomatic human immunodeficiency virus [HIV] infection status: Secondary | ICD-10-CM | POA: Diagnosis not present

## 2023-11-08 ENCOUNTER — Ambulatory Visit: Admitting: Endocrinology

## 2023-11-08 ENCOUNTER — Encounter: Payer: Self-pay | Admitting: Endocrinology

## 2023-11-08 ENCOUNTER — Other Ambulatory Visit: Payer: Self-pay

## 2023-11-08 ENCOUNTER — Ambulatory Visit: Payer: Self-pay | Admitting: Endocrinology

## 2023-11-08 VITALS — BP 136/82 | HR 84 | Resp 16 | Ht 64.0 in | Wt 234.4 lb

## 2023-11-08 DIAGNOSIS — Z794 Long term (current) use of insulin: Secondary | ICD-10-CM

## 2023-11-08 DIAGNOSIS — E1165 Type 2 diabetes mellitus with hyperglycemia: Secondary | ICD-10-CM | POA: Diagnosis not present

## 2023-11-08 LAB — POCT GLYCOSYLATED HEMOGLOBIN (HGB A1C): Hemoglobin A1C: 8.1 % — AB (ref 4.0–5.6)

## 2023-11-08 MED ORDER — OZEMPIC (0.25 OR 0.5 MG/DOSE) 2 MG/3ML ~~LOC~~ SOPN
PEN_INJECTOR | SUBCUTANEOUS | 4 refills | Status: DC
  Filled 2023-11-08: qty 3, 28d supply, fill #0
  Filled 2024-01-20: qty 3, 28d supply, fill #1

## 2023-11-08 MED ORDER — INSULIN GLARGINE (1 UNIT DIAL) 300 UNIT/ML ~~LOC~~ SOPN
50.0000 [IU] | PEN_INJECTOR | Freq: Every day | SUBCUTANEOUS | 3 refills | Status: DC
  Filled 2023-11-08: qty 13.5, 81d supply, fill #0

## 2023-11-08 MED ORDER — METFORMIN HCL ER 500 MG PO TB24: 500.0000 mg | ORAL_TABLET | Freq: Every day | ORAL | 3 refills | Status: DC

## 2023-11-08 NOTE — Progress Notes (Signed)
 Outpatient Endocrinology Note [place] Abdirahim Flavell, MD   Patient's Name: Carrie Dominguez    DOB: [birth year]    MRN: [id number]                                                    REASON OF VISIT: Follow up for type 2 diabetes mellitus  REFERRING PROVIDER: Cyndi Shaver, PA-C  PCP: Jason Leita Repine, FNP (Inactive)  HISTORY OF PRESENT ILLNESS:   Carrie Dominguez is a 48 y.o. old female with past medical history listed below, is here for new consult / follow up for type 2 diabetes mellitus.   Pertinent Diabetes History: Patient is referred to endocrinology for evaluation and management of type 2 diabetes mellitus.  Patient was diagnosed with type 2 diabetes mellitus in November 2022, hemoglobin A1c was 9.9% at the time of diagnosis.  Patient was prediabetic prior to diagnosis.  Patient has been on insulin  therapy from early 2023.  Patient has uncontrolled type 2 diabetes mellitus with hemoglobin A1c in the range of 8 to 9.7% range.  History of DKA or diabetes related hospitalizations: none  Previous diabetes education: Yes   Family h/o diabetes mellitus: both parents and Son.   No personal history of pancreatitis and / or family history of medullary thyroid carcinoma or MEN 2B syndrome.   Chronic Diabetes Complications : Retinopathy: no. Last ophthalmology exam was done on 04/2023, following with ophthalmology regularly.  Nephropathy: no, on ACE/ARB /lisinopril . Peripheral neuropathy: no Coronary artery disease: no Stroke: no  Relevant comorbidities and cardiovascular risk factors: Obesity: yes Body mass index is 40.23 kg/m.  Hypertension: Yes  Hyperlipidemia : Yes, on statin   Current / Home Diabetic regimen includes:  Toujeo  50 units daily. Metformin  extended release 500 mg 1 tablet daily.  Prior diabetic medications: Ozempic up to 1 mg weekly, switched to Mounjaro  in January 2025.  Patient had significant GI intolerance with Mounjaro  7.5 mg weekly and stopped in  August 2025.  Glycemic data:   No glucose data to review.  Hypoglycemia: Patient has no hypoglycemic episodes. Patient has hypoglycemia awareness.  Factors modifying glucose control: 1.  Diabetic diet assessment: 3 meals a day.  She reports she does not eat much bread and no sodas however she drinks fruit juices.  Not paying much attention to diabetic diet.  2.  Staying active or exercising:   3.  Medication compliance: compliant all of the time.  Interval history  Patient presented to establish endocrinology care for uncontrolled type 2 diabetes mellitus.  REVIEW OF SYSTEMS As per history of present illness.   PAST MEDICAL HISTORY: Past Medical History:  Diagnosis Date   HIV (human immunodeficiency virus infection) (HCC)    Obesity     PAST SURGICAL HISTORY: Past Surgical History:  Procedure Laterality Date   ABDOMINAL HYSTERECTOMY  2014   TOOTH EXTRACTION      ALLERGIES: Allergies  Allergen Reactions   Vancomycin Hives, Anaphylaxis, Other (See Comments) and Rash   Ciprofloxacin Hcl Other (See Comments), Hives and Rash   Sulfa Antibiotics Rash   Bactrim Hives   Prochlorperazine     A bad reaction    Ciprofloxacin Hcl Rash   Sulfamethoxazole-Trimethoprim Hives, Other (See Comments) and Rash    FAMILY HISTORY:  History reviewed. No pertinent family history.  SOCIAL HISTORY: Social History   Socioeconomic History  Marital status: Married    Spouse name: Not on file   Number of children: Not on file   Years of education: Not on file   Highest education level: Not on file  Occupational History   Not on file  Tobacco Use   Smoking status: Never   Smokeless tobacco: Never  Vaping Use   Vaping status: Never Used  Substance and Sexual Activity   Alcohol use: No   Drug use: No   Sexual activity: Yes    Birth control/protection: Surgical  Other Topics Concern   Not on file  Social History Narrative   Not on file   Social Drivers of Health    Financial Resource Strain: Not on file  Food Insecurity: Low Risk  (03/25/2023)   Received from [facility]   Hunger Vital Sign    Within the past 12 months, you worried that your food would run out before you got money to buy more: Never true    Within the past 12 months, the food you bought just didn't last and you didn't have money to get more. : Never true  Transportation Needs: No Transportation Needs (03/25/2023)   Received from [facility]    In the past 12 months, has lack of reliable transportation kept you from medical appointments, meetings, work or from getting things needed for daily living? : No  Physical Activity: Not on file  Stress: Not on file  Social Connections: Unknown (06/08/2021)   Received from [facility]   Social Network    Social Network: Not on file    MEDICATIONS:  Current Outpatient Medications  Medication Sig Dispense Refill   atorvastatin  (LIPITOR) 10 MG tablet Take 1 tablet (10 mg total) by mouth daily. 90 tablet 3   BD PEN NEEDLE NANO 2ND GEN 32G X 4 MM MISC Use w/ daily insulin  injection 100 each 2   Blood Glucose Monitoring Suppl (BLOOD GLUCOSE MONITOR SYSTEM) w/Device KIT 1 each by Does not apply route in the morning, at noon, and at bedtime. May substitute to any manufacturer covered by patient's insurance. 1 kit 0   cabotegravir & rilpivirine ER (CABENUVA) 600 & 900 MG/3ML injection Inject into the muscle.     Continuous Glucose Sensor (DEXCOM G7 SENSOR) MISC Use to continuously monitor blood sugar, replace every 10 days 3 each 3   Glucose Blood (BLOOD GLUCOSE TEST STRIPS) STRP Use to test blood sugar three times daily as directed 100 strip 0   lisinopril  (ZESTRIL ) 10 MG tablet Take 1 tablet (10 mg total) by mouth daily. 90 tablet 3   Semaglutide,0.25 or 0.5MG /DOS, (OZEMPIC, 0.25 OR 0.5 MG/DOSE,) 2 MG/3ML SOPN Take 0.25 mg weekly for 4 weeks and increase to 0.5 mg weekly. 3 mL 4   valACYclovir  (VALTREX ) 1000 MG tablet Take 1  tablet (1,000 mg total) by mouth daily. 90 tablet 1   insulin  glargine, 1 Unit Dial , (TOUJEO ) 300 UNIT/ML Solostar Pen Inject 50 Units into the skin daily. 30 mL 3   metFORMIN  (GLUCOPHAGE -XR) 500 MG 24 hr tablet Take 1 tablet (500 mg total) by mouth daily with breakfast. 90 tablet 3   No current facility-administered medications for this visit.    PHYSICAL EXAM: Vitals:   11/08/23 1020  BP: 136/82  Pulse: 84  Resp: 16  SpO2: 99%  Weight: 234 lb 6.4 oz (106.3 kg)  Height: 5' 4 (1.626 m)   Body mass index is 40.23 kg/m.  Wt Readings from Last 3 Encounters:  11/08/23 234 lb 6.4 oz (106.3 kg)  09/23/23 232 lb 6.4 oz (105.4 kg)  07/22/23 230 lb (104.3 kg)    General: Well developed, well nourished female in no apparent distress.  HEENT: AT/[place], no external lesions.  Eyes: Conjunctiva clear and no icterus. Neck: Neck supple  Lungs: Respirations not labored Neurologic: Alert, oriented, normal speech Extremities / Skin: Dry.  Psychiatric: Does not appear depressed or anxious  Diabetic Foot Exam - Simple   No data filed    LABS Reviewed Lab Results  Component Value Date   HGBA1C 8.1 (A) 11/08/2023   HGBA1C 8.1 (H) 07/22/2023   HGBA1C 9.7 (H) 04/21/2023   No results found for: FRUCTOSAMINE Lab Results  Component Value Date   CHOL 139 07/22/2023   HDL 41 (L) 07/22/2023   LDLCALC 80 07/22/2023   TRIG 96 07/22/2023   CHOLHDL 3.4 07/22/2023   Lab Results  Component Value Date   MICRALBCREAT 9 07/22/2023   Lab Results  Component Value Date   CREATININE 0.81 07/22/2023   Lab Results  Component Value Date   GFR 76.33 04/21/2023    ASSESSMENT / PLAN  1. Uncontrolled type 2 diabetes mellitus with hyperglycemia, with long-term current use of insulin  (HCC)   2. Type 2 diabetes mellitus with hyperglycemia, with long-term current use of insulin  (HCC)     Diabetes Mellitus type 2, complicated by no other known complications. - Diabetic status / severity:  Uncontrolled.  Lab Results  Component Value Date   HGBA1C 8.1 (A) 11/08/2023    - Hemoglobin A1c goal : <6.5%  Discussed about type 2 diabetes mellitus and potential chronic diabetic complications including diabetic retinopathy, neuropathy and nephropathy.  Discussed about importance of controlling blood sugar.  Discussed about most important for type 2 diabetes is controlling and maintaining diabetic diet.  Discussed in detail.  She has no obvious contraindication for GLP-1 receptor agonist.  When she was taking Ozempic she was not having significant GI issues when taking 1 mg weekly and also she had noted improvement on her diabetes control.  She has significant GI issues with Mounjaro  and stopped about a month ago.  She will benefit from GLP-1 receptor agonist even to have weight loss benefit, including diabetes control.  She agreed to restart Ozempic.  - Medications: See below.  I) continue Toujeo  50 units daily. II) continue metformin  extended release 500 mg 1 tablet daily.  Will increase dose in the future. III) start Ozempic 0.25 mg weekly for 4 weeks and increase to 0.5 mg weekly.  - Home glucose testing: Check in the morning fasting and bedtime and asked to bring glucometer in the follow-up visit.  - Discussed/ Gave Hypoglycemia treatment plan.  # Consult : Refer to diabetic educator/dietitian.  # Annual urine for microalbuminuria/ creatinine ratio, no microalbuminuria currently, continue ACE/ARB /lisinopril . Last  Lab Results  Component Value Date   MICRALBCREAT 9 07/22/2023    # Foot check nightly.  # Annual dilated diabetic eye exams.   - Diet: Make healthy diabetic food choices - Life style / activity / exercise: Discussed.  2. Blood pressure  -  BP Readings from Last 1 Encounters:  11/08/23 136/82    - Control is in target.  - No change in current plans.  3. Lipid status / Hyperlipidemia - Last  Lab Results  Component Value Date   LDLCALC 80  07/22/2023   - Continue atorvastatin  10 mg daily, managed by PCP.  Diagnoses and all orders for this visit:  Uncontrolled type 2 diabetes mellitus with hyperglycemia, with long-term current use of insulin  (HCC) -     POCT glycosylated hemoglobin (Hb A1C) -     Amb Referral to Nutrition and Diabetic Education -     Semaglutide,0.25 or 0.5MG /DOS, (OZEMPIC, 0.25 OR 0.5 MG/DOSE,) 2 MG/3ML SOPN; Take 0.25 mg weekly for 4 weeks and increase to 0.5 mg weekly.  Type 2 diabetes mellitus with hyperglycemia, with long-term current use of insulin  (HCC) -     metFORMIN  (GLUCOPHAGE -XR) 500 MG 24 hr tablet; Take 1 tablet (500 mg total) by mouth daily with breakfast. -     insulin  glargine, 1 Unit Dial , (TOUJEO ) 300 UNIT/ML Solostar Pen; Inject 50 Units into the skin daily.    DISPOSITION Follow up in clinic in 3  months suggested.   All questions answered and patient verbalized understanding of the plan.  [place] Artha Chiasson, MD [facility] Endocrinology [facility] [address] [place], [address] [place], [place] [address] Phone # [phone]  At least part of this note was generated using voice recognition software. Inadvertent word errors may have occurred, which were not recognized during the proofreading process.

## 2023-11-09 ENCOUNTER — Other Ambulatory Visit: Payer: Self-pay

## 2023-11-22 ENCOUNTER — Other Ambulatory Visit: Payer: Self-pay

## 2023-11-22 ENCOUNTER — Ambulatory Visit: Admitting: Nurse Practitioner

## 2023-11-22 ENCOUNTER — Encounter: Payer: Self-pay | Admitting: Nurse Practitioner

## 2023-11-22 VITALS — BP 128/76 | HR 95 | Temp 98.2°F | Ht 64.0 in | Wt 235.2 lb

## 2023-11-22 DIAGNOSIS — H00015 Hordeolum externum left lower eyelid: Secondary | ICD-10-CM

## 2023-11-22 MED ORDER — ERYTHROMYCIN 5 MG/GM OP OINT
1.0000 | TOPICAL_OINTMENT | Freq: Three times a day (TID) | OPHTHALMIC | 0 refills | Status: AC
  Filled 2023-11-22: qty 3.5, 5d supply, fill #0

## 2023-11-22 NOTE — Patient Instructions (Signed)
 Stye A stye, also known as a hordeolum, is a bump that forms on an eyelid. It may look like a pimple next to the eyelash. A stye can form inside the eyelid (internal stye) or outside the eyelid (external stye). A stye can cause redness, swelling, and pain on the eyelid. Styes are very common. Anyone can get them at any age. They usually occur in just one eye at a time, but you may have more than one in either eye. What are the causes? A stye is caused by an infection. The infection is almost always caused by bacteria called Staphylococcus aureus. This is a common type of bacteria that lives on the skin. An internal stye may result from an infected oil-producing gland inside the eyelid. An external stye may be caused by an infection at the base of the eyelash (hair follicle). What increases the risk? You are more likely to develop a stye if: You have had a stye before. You have any of these conditions: Red, itchy, inflamed eyelids (blepharitis). A skin condition such as seborrheic dermatitis or rosacea. High fat levels in your blood (lipids). Dry eyes. What are the signs or symptoms? The most common symptom of a stye is eyelid pain. Internal styes are more painful than external styes. Other symptoms may include: Painful swelling of your eyelid. A scratchy feeling in your eye. Tearing and redness of your eye. A pimple-like bump on the edge of the eyelid. Pus draining from the stye. How is this diagnosed? Your health care provider may be able to diagnose a stye just by examining your eye. The health care provider may also check to make sure: You do not have a fever or other signs of a more serious infection. The infection has not spread to other parts of your eye or areas around your eye. How is this treated? Most styes will clear up in a few days without treatment or with warm compresses applied to the area. You may need to use antibiotic drops or ointment to treat an infection. Sometimes,  steroid drops or ointment are used in addition to antibiotics. In some cases, your health care provider may give you a small steroid injection in the eyelid. If your stye does not heal with routine treatment, your health care provider may drain pus from the stye using a thin blade or needle. This may be done if the stye is large, causing a lot of pain, or affecting your vision. Follow these instructions at home: Take over-the-counter and prescription medicines only as told by your health care provider. This includes eye drops or ointments. If you were prescribed an antibiotic medicine, steroid medicine, or both, apply or use them as told by your health care provider. Do not stop using the medicine even if your condition improves. Apply a warm, wet cloth (warm compress) to your eye for 5-10 minutes, 4 to 6 times a day. Clean the affected eyelid as directed by your health care provider. Do not wear contact lenses or eye makeup until your stye has healed and your health care provider says that it is safe. Do not try to pop or drain the stye. Do not rub your eye. Contact a health care provider if: You have chills or a fever. Your stye does not go away after several days. Your stye affects your vision. Your eyeball becomes swollen, red, or painful. Get help right away if: You have pain when moving your eye around. Summary A stye is a bump that forms  on an eyelid. It may look like a pimple next to the eyelash. A stye can form inside the eyelid (internal stye) or outside the eyelid (external stye). A stye can cause redness, swelling, and pain on the eyelid. Your health care provider may be able to diagnose a stye just by examining your eye. Apply a warm, wet cloth (warm compress) to your eye for 5-10 minutes, 4 to 6 times a day. This information is not intended to replace advice given to you by your health care provider. Make sure you discuss any questions you have with your health care  provider. Document Revised: 04/02/2020 Document Reviewed: 04/02/2020 Elsevier Patient Education  2024 [employer].

## 2023-11-22 NOTE — Progress Notes (Signed)
 Acute Office Visit  Subjective:    Patient ID: Carrie Dominguez, female    DOB: Jun 25, 1975, 48 y.o.   MRN: [id number]  Chief Complaint  Patient presents with   Eye Problem    Left eye swelling no pain started yesterday this morning white discharge     Eye Problem  The left eye is affected. This is a new problem. The current episode started yesterday. The problem occurs constantly. The problem has been unchanged. There was no injury mechanism. The pain is at a severity of 0/10. The patient is experiencing no pain. There is No known exposure to pink eye. She Does not wear contacts. Associated symptoms include an eye discharge and a foreign body sensation. Pertinent negatives include no blurred vision, double vision, eye redness, fever, itching, nausea, photophobia, recent URI or vomiting. She has tried nothing for the symptoms.   Outpatient Medications Prior to Visit  Medication Sig   atorvastatin  (LIPITOR) 10 MG tablet Take 1 tablet (10 mg total) by mouth daily.   BD PEN NEEDLE NANO 2ND GEN 32G X 4 MM MISC Use w/ daily insulin  injection   Blood Glucose Monitoring Suppl (BLOOD GLUCOSE MONITOR SYSTEM) w/Device KIT 1 each by Does not apply route in the morning, at noon, and at bedtime. May substitute to any manufacturer covered by patient's insurance.   cabotegravir & rilpivirine ER (CABENUVA) 600 & 900 MG/3ML injection Inject into the muscle.   Continuous Glucose Sensor (DEXCOM G7 SENSOR) MISC Use to continuously monitor blood sugar, replace every 10 days   insulin  glargine, 1 Unit Dial , (TOUJEO ) 300 UNIT/ML Solostar Pen Inject 50 Units into the skin daily.   lisinopril  (ZESTRIL ) 10 MG tablet Take 1 tablet (10 mg total) by mouth daily.   metFORMIN  (GLUCOPHAGE -XR) 500 MG 24 hr tablet Take 1 tablet (500 mg total) by mouth daily with breakfast.   metFORMIN  (GLUCOPHAGE -XR) 500 MG 24 hr tablet Take 1 tablet (500 mg total) by mouth daily with breakfast.   Semaglutide,0.25 or 0.5MG /DOS, (OZEMPIC,  0.25 OR 0.5 MG/DOSE,) 2 MG/3ML SOPN Take 0.25 mg weekly for 4 weeks and increase to 0.5 mg weekly.   valACYclovir  (VALTREX ) 1000 MG tablet Take 1 tablet (1,000 mg total) by mouth daily.   No facility-administered medications prior to visit.    Reviewed past medical and social history.  Review of Systems  Constitutional:  Negative for fever.  Eyes:  Positive for discharge. Negative for blurred vision, double vision, photophobia, redness and itching.  Gastrointestinal:  Negative for nausea and vomiting.   Per HPI     Objective:    Physical Exam Vitals and nursing note reviewed.  Constitutional:      General: She is not in acute distress. Eyes:     General: Vision grossly intact.        Right eye: No foreign body, discharge or hordeolum.        Left eye: Hordeolum present.No foreign body or discharge.     Extraocular Movements: Extraocular movements intact.     Conjunctiva/sclera: Conjunctivae normal.   Neurological:     Mental Status: She is alert.     BP 128/76 (BP Location: Left Arm, Patient Position: Sitting, Cuff Size: Large)   Pulse 95   Temp 98.2 F (36.8 C) (Oral)   Ht 5' 4 (1.626 m)   Wt 235 lb 3.2 oz (106.7 kg)   LMP 11/06/2011   SpO2 96%   BMI 40.37 kg/m    No results found for any visits on  11/22/23.     Assessment & Plan:   Problem List Items Addressed This Visit   None Visit Diagnoses       Hordeolum externum of left lower eyelid    -  Primary   Relevant Medications   erythromycin ophthalmic ointment      Meds ordered this encounter  Medications   erythromycin ophthalmic ointment    Sig: Place 1 Application into the left eye 3 (three) times daily for 5 days.    Dispense:  15 g    Refill:  0    Supervising Provider:   BERNETA FALLOW ALFRED [[id number]]    Advised to use warm compress 3x/day and clean eyes with mild soap.  Return if symptoms worsen or fail to improve.    Roselie Mood, NP

## 2023-11-29 ENCOUNTER — Telehealth: Payer: Self-pay | Admitting: *Deleted

## 2023-11-29 NOTE — Telephone Encounter (Signed)
 [facility] sent over fax for meter and test strips.  Looks like pt has appt with [facility] on 12/29/23 (internal med)

## 2023-11-30 DIAGNOSIS — B20 Human immunodeficiency virus [HIV] disease: Secondary | ICD-10-CM | POA: Diagnosis not present

## 2023-12-02 ENCOUNTER — Other Ambulatory Visit: Payer: Self-pay

## 2023-12-02 MED ORDER — FREESTYLE LITE TEST VI STRP
ORAL_STRIP | 1 refills | Status: AC
  Filled 2023-12-02: qty 300, 90d supply, fill #0

## 2023-12-02 MED ORDER — FREESTYLE LITE W/DEVICE KIT
PACK | 0 refills | Status: AC
  Filled 2023-12-02: qty 1, 1d supply, fill #0

## 2023-12-02 MED ORDER — FREESTYLE LANCETS MISC
1 refills | Status: AC
  Filled 2023-12-02: qty 300, 90d supply, fill #0

## 2023-12-02 NOTE — Telephone Encounter (Signed)
 Rx's sent in.

## 2023-12-05 ENCOUNTER — Other Ambulatory Visit: Payer: Self-pay

## 2023-12-19 ENCOUNTER — Encounter: Payer: Self-pay | Admitting: Obstetrics and Gynecology

## 2023-12-19 ENCOUNTER — Other Ambulatory Visit (HOSPITAL_COMMUNITY)
Admission: RE | Admit: 2023-12-19 | Discharge: 2023-12-19 | Disposition: A | Discharge status: Home / Self Care | Source: Ambulatory Visit | Attending: Obstetrics and Gynecology | Admitting: Obstetrics and Gynecology

## 2023-12-19 ENCOUNTER — Ambulatory Visit: Admitting: Obstetrics and Gynecology

## 2023-12-19 VITALS — BP 130/61 | HR 87 | Wt 232.0 lb

## 2023-12-19 DIAGNOSIS — B20 Human immunodeficiency virus [HIV] disease: Secondary | ICD-10-CM

## 2023-12-19 DIAGNOSIS — Z124 Encounter for screening for malignant neoplasm of cervix: Secondary | ICD-10-CM | POA: Insufficient documentation

## 2023-12-19 NOTE — Progress Notes (Signed)
 NEW GYNECOLOGY VISIT No chief complaint on file.    Subjective:  Carrie Dominguez is a 48 y.o. [id number] who presents to establish care.  Has hx abnormal pap smear. She thinks her last pap was in 2019 or 2020. She states it was abnormal and was supposed to follow up.  She had a total vaginal hysterectomy with bilateral salpingectomy 2016 with benign pathology (see Care Everywhere) for abnormal uterine bleeding. Her previous pap history available for my review is below  Pap: 2018: NILM 2016: NILM/HPV  2011: NILM 2010: ASCUS HPV +  Gyn History: Patient's last menstrual period was 11/06/2011. Sexually active: yes/no: Yes Contraception: hysterectomy History of STIs: HIV+ Last pap: No results found for: DIAGPAP, HPV, HPVHIGH History of abnormal pap: see above     OB History     Gravida  3   Para  3   Term  3   Preterm      AB      Living  2      SAB      IAB      Ectopic      Multiple      Live Births  2           Past Medical History:  Diagnosis Date   HIV (human immunodeficiency virus infection) (HCC)    Obesity     Past Surgical History:  Procedure Laterality Date   ABDOMINAL HYSTERECTOMY  2014   TOOTH EXTRACTION      Social History   Socioeconomic History   Marital status: Married    Spouse name: Not on file   Number of children: Not on file   Years of education: Not on file   Highest education level: Not on file  Occupational History   Not on file  Tobacco Use   Smoking status: Never   Smokeless tobacco: Never  Vaping Use   Vaping status: Never Used  Substance and Sexual Activity   Alcohol use: No   Drug use: No   Sexual activity: Yes    Birth control/protection: Surgical  Other Topics Concern   Not on file  Social History Narrative   Not on file   Social Drivers of Health   Financial Resource Strain: Not on file  Food Insecurity: Low Risk  (11/30/2023)   Received from [facility]   Hunger Vital Sign     Within the past 12 months, you worried that your food would run out before you got money to buy more: Never true    Within the past 12 months, the food you bought just didn't last and you didn't have money to get more. : Never true  Transportation Needs: No Transportation Needs (11/30/2023)   Received from [facility]    In the past 12 months, has lack of reliable transportation kept you from medical appointments, meetings, work or from getting things needed for daily living? : No  Physical Activity: Not on file  Stress: Not on file  Social Connections: Unknown (06/08/2021)   Received from [facility]   Social Network    Social Network: Not on file    History reviewed. No pertinent family history.  Current Outpatient Medications on File Prior to Visit  Medication Sig Dispense Refill   atorvastatin  (LIPITOR) 10 MG tablet Take 1 tablet (10 mg total) by mouth daily. 90 tablet 3   BD PEN NEEDLE NANO 2ND GEN 32G X 4 MM MISC Use w/ daily insulin  injection 100  each 2   Blood Glucose Monitoring Suppl (FREESTYLE LITE) w/Device KIT Use to check sugar 3 times a day.  Dx code: E11.65 1 kit 0   cabotegravir & rilpivirine ER (CABENUVA) 600 & 900 MG/3ML injection Inject into the muscle.     Continuous Glucose Sensor (DEXCOM G7 SENSOR) MISC Use to continuously monitor blood sugar, replace every 10 days 3 each 3   glucose blood (FREESTYLE LITE) test strip Use to check sugar 3 times a day.  Dx code: E11.65 300 each 1   insulin  glargine, 1 Unit Dial , (TOUJEO ) 300 UNIT/ML Solostar Pen Inject 50 Units into the skin daily. 30 mL 3   Lancets (FREESTYLE) lancets Use to check sugar 3 times a day.  Dx code: E11.65 300 each 1   lisinopril  (ZESTRIL ) 10 MG tablet Take 1 tablet (10 mg total) by mouth daily. 90 tablet 3   metFORMIN  (GLUCOPHAGE -XR) 500 MG 24 hr tablet Take 1 tablet (500 mg total) by mouth daily with breakfast. 90 tablet 1   metFORMIN  (GLUCOPHAGE -XR) 500 MG 24 hr tablet Take 1 tablet  (500 mg total) by mouth daily with breakfast. 90 tablet 3   Semaglutide,0.25 or 0.5MG /DOS, (OZEMPIC, 0.25 OR 0.5 MG/DOSE,) 2 MG/3ML SOPN Take 0.25 mg weekly for 4 weeks and increase to 0.5 mg weekly. 3 mL 4   valACYclovir  (VALTREX ) 1000 MG tablet Take 1 tablet (1,000 mg total) by mouth daily. 90 tablet 1   No current facility-administered medications on file prior to visit.    Allergies  Allergen Reactions   Vancomycin Hives, Anaphylaxis, Other (See Comments) and Rash   Ciprofloxacin Hcl Other (See Comments), Hives and Rash   Sulfa Antibiotics Rash   Bactrim Hives   Prochlorperazine     A bad reaction    Ciprofloxacin Hcl Rash   Sulfamethoxazole-Trimethoprim Hives, Other (See Comments) and Rash     Objective:   Vitals:   12/19/23 1350  BP: 130/61  Pulse: 87  Weight: 232 lb (105.2 kg)   Physical Examination:   General appearance - well appearing, and in no distress  Mental status - alert, oriented to person, place, and time  Psych:  normal mood and affect  Abdomen - soft, nontender, nondistended, no masses or organomegaly  Pelvic -  VULVA: normal appearing vulva with no masses, tenderness or lesions   VAGINA: normal appearing vagina with normal color and discharge, no lesions   CERVIX: surgically absent  UTERUS: surgically absent  ADNEXA: No adnexal masses or tenderness noted.  Extremities:  No swelling or varicosities noted  Chaperone present for exam  Assessment and Plan:    1. Cervical cancer screening Reviewed that for medically stable patients with HIV who have undergone hysterectomy for benign disease and who have no history of cervical intraepithelial neoplasia (CIN), pap smear screening is not required. If there is a change in her HIV status (increasing viral load, falling CD4 levels), then pap smear can be performed. Since we do not know the abnormality on her 2019/2020 pap, pap was performed today, if normal, likely ok to discontinue screening. I have also  asked her to get her last pap result for my review so that we can determine appropriate follow up   2. Human immunodeficiency virus (HIV) disease (HCC) (Primary) Well controlled HIV viral load < 20 Normal T helper cells  No follow-ups on file.  Future Appointments  Date Time Provider Department Center  02/13/2024  9:40 AM Thapa, [place], MD [facility] None    Rollo DASEN  Abigail, MD, FACOG Obstetrician & Gynecologist, [facility] for [employer], [facility]

## 2023-12-26 LAB — CYTOLOGY - PAP
Comment: NEGATIVE
Comment: NEGATIVE
Comment: NEGATIVE
Diagnosis: UNDETERMINED — AB
HPV 16: POSITIVE — AB
HPV 18 / 45: NEGATIVE
High risk HPV: POSITIVE — AB

## 2023-12-27 ENCOUNTER — Ambulatory Visit: Payer: Self-pay | Admitting: Obstetrics and Gynecology

## 2023-12-27 NOTE — Telephone Encounter (Signed)
 Patient made aware of pap results and recommendation of a Colposcopy with Dr. Abigail.  Patient agreed and was scheduled for 01/26/24.  Erminio DELENA Rumps, RN

## 2023-12-27 NOTE — Telephone Encounter (Signed)
-----   Message from Rollo ONEIDA Bring sent at 12/27/2023  1:15 PM EST ----- ASCUS/HPV+. Needs colposcopy ----- Message ----- From: Interface, Lab In [facility] Sent: 12/26/2023   4:46 PM EST To: Rollo ONEIDA Bring, MD

## 2024-01-03 DIAGNOSIS — Z21 Asymptomatic human immunodeficiency virus [HIV] infection status: Secondary | ICD-10-CM | POA: Diagnosis not present

## 2024-01-20 ENCOUNTER — Other Ambulatory Visit: Payer: Self-pay

## 2024-01-21 ENCOUNTER — Other Ambulatory Visit: Payer: Self-pay

## 2024-01-23 ENCOUNTER — Other Ambulatory Visit: Payer: Self-pay

## 2024-01-26 ENCOUNTER — Ambulatory Visit: Admitting: Obstetrics and Gynecology

## 2024-01-26 ENCOUNTER — Other Ambulatory Visit (HOSPITAL_COMMUNITY): Admission: RE | Admit: 2024-01-26 | Source: Ambulatory Visit | Admitting: Obstetrics and Gynecology

## 2024-01-26 VITALS — BP 127/58 | HR 108 | Wt 233.0 lb

## 2024-01-26 DIAGNOSIS — R87810 Cervical high risk human papillomavirus (HPV) DNA test positive: Secondary | ICD-10-CM | POA: Insufficient documentation

## 2024-01-26 DIAGNOSIS — R87610 Atypical squamous cells of undetermined significance on cytologic smear of cervix (ASC-US): Secondary | ICD-10-CM | POA: Insufficient documentation

## 2024-01-26 NOTE — Progress Notes (Signed)
° ° °  GYNECOLOGY OFFICE COLPOSCOPY PROCEDURE NOTE  48 y.o. [id number] here for colposcopy for ASCUS/HPV+ pap smear, hx hysterectomy  Reviewed the risks of pain, bleeding, infection, inadequate sample. Patient gave informed written consent, time out was performed.  Placed in lithotomy position. Vagina and cuff were viewed with speculum and colposcope after application of acetic acid.   Very mild acetowhite changes noted at left posterior aspect of cuff; corresponding biopsy obtained.   Silver nitrate applied. All specimens were labeled and sent to pathology.  Chaperone was present during entire procedure.  Patient was given post procedure instructions.  Will follow up pathology and manage accordingly; patient will be contacted with results and recommendations.    Rollo ONEIDA Bring, MD, FACOG Obstetrician & Gynecologist, [facility] for [facility], [facility]

## 2024-01-27 ENCOUNTER — Other Ambulatory Visit: Payer: Self-pay

## 2024-01-28 ENCOUNTER — Ambulatory Visit: Payer: Self-pay | Admitting: Obstetrics and Gynecology

## 2024-01-28 LAB — SURGICAL PATHOLOGY

## 2024-02-07 ENCOUNTER — Other Ambulatory Visit: Payer: Self-pay

## 2024-02-08 ENCOUNTER — Other Ambulatory Visit: Payer: Self-pay

## 2024-02-13 ENCOUNTER — Encounter: Payer: Self-pay | Admitting: Endocrinology

## 2024-02-13 ENCOUNTER — Ambulatory Visit: Payer: Self-pay | Admitting: Endocrinology

## 2024-02-13 ENCOUNTER — Other Ambulatory Visit: Payer: Self-pay

## 2024-02-13 ENCOUNTER — Ambulatory Visit: Admitting: Endocrinology

## 2024-02-13 VITALS — BP 116/80 | HR 110 | Resp 16 | Ht 64.0 in | Wt 235.6 lb

## 2024-02-13 DIAGNOSIS — E1169 Type 2 diabetes mellitus with other specified complication: Secondary | ICD-10-CM

## 2024-02-13 DIAGNOSIS — Z794 Long term (current) use of insulin: Secondary | ICD-10-CM | POA: Diagnosis not present

## 2024-02-13 DIAGNOSIS — E1165 Type 2 diabetes mellitus with hyperglycemia: Secondary | ICD-10-CM

## 2024-02-13 LAB — POCT GLYCOSYLATED HEMOGLOBIN (HGB A1C): Hemoglobin A1C: 8.6 % — AB (ref 4.0–5.6)

## 2024-02-13 MED ORDER — OZEMPIC (0.25 OR 0.5 MG/DOSE) 2 MG/3ML ~~LOC~~ SOPN
0.2500 mg | PEN_INJECTOR | SUBCUTANEOUS | 4 refills | Status: AC
  Filled 2024-02-13: qty 9, 84d supply, fill #0

## 2024-02-13 MED ORDER — METFORMIN HCL ER 500 MG PO TB24
1000.0000 mg | ORAL_TABLET | Freq: Every day | ORAL | 3 refills | Status: AC
  Filled 2024-02-13: qty 180, 90d supply, fill #0

## 2024-02-13 MED ORDER — INSULIN GLARGINE (1 UNIT DIAL) 300 UNIT/ML ~~LOC~~ SOPN
55.0000 [IU] | PEN_INJECTOR | Freq: Every day | SUBCUTANEOUS | 3 refills | Status: AC
  Filled 2024-02-13: qty 15, 81d supply, fill #0
  Filled 2024-02-20: qty 6, 32d supply, fill #0

## 2024-02-13 MED ORDER — DEXCOM G7 SENSOR MISC
3 refills | Status: AC
  Filled 2024-02-13: qty 9, 90d supply, fill #0
  Filled 2024-02-20: qty 3, 30d supply, fill #0

## 2024-02-13 NOTE — Progress Notes (Signed)
 "  Outpatient Endocrinology Note Carrie Fjeld, MD   Patient's Name: Carrie Dominguez    DOB: Jun 23, 1975    MRN: [id number]                                                    REASON OF VISIT: Follow up for type 2 diabetes mellitus  REFERRING PROVIDER: Cyndi Shaver, PA-C  PCP: Jason Leita Repine, FNP (Inactive)  HISTORY OF PRESENT ILLNESS:   Carrie Dominguez is a 49 y.o. old female with past medical history listed below, is here for follow up for type 2 diabetes mellitus.   Pertinent Diabetes History: Patient was referred to endocrinology for evaluation and management of type 2 diabetes mellitus.  Patient was diagnosed with type 2 diabetes mellitus in November 2022, hemoglobin A1c was 9.9% at the time of diagnosis.  Patient was prediabetic prior to diagnosis.  Initial visit in September 2025.  Patient has been on insulin  therapy from early 2023.  Patient has uncontrolled type 2 diabetes mellitus with hemoglobin A1c in the range of 8 to 9.7% range.  History of DKA or diabetes related hospitalizations: none  Previous diabetes education: Yes   Family h/o diabetes mellitus: both parents and Son.   No personal history of pancreatitis and / or family history of medullary thyroid carcinoma or MEN 2B syndrome.   Chronic Diabetes Complications : Retinopathy: no. Last ophthalmology exam was done on 04/2023, following with ophthalmology regularly.  Nephropathy: no, on ACE/ARB /lisinopril . Peripheral neuropathy: no Coronary artery disease: no Stroke: no  Relevant comorbidities and cardiovascular risk factors: Obesity: yes Body mass index is 40.44 kg/m.  Hypertension: Yes  Hyperlipidemia : Yes, on statin   Current / Home Diabetic regimen includes:  Toujeo  50 units daily. Metformin  extended release 500 mg 1 tablet daily. Ozempic  0.25 mg weekly.  Not taking for several weeks.  Prior diabetic medications: Ozempic  up to 1 mg weekly, switched to Mounjaro  in January 2025.  Patient  had significant GI intolerance with Mounjaro  7.5 mg weekly and stopped in August 2025.  Glycemic data:    CONTINUOUS GLUCOSE MONITORING SYSTEM (CGMS) INTERPRETATION:          Dexcom G7 CGM-  Sensor Download (Sensor download was reviewed and summarized below.) Dates: December 17 - February 07, 2024, 14 days  Glucose Management Indicator: 9.3% Sensor usage:90 %    Impression: - Mostly hyperglycemia postprandially with blood sugar in the range of 200-300s.  Some of the days persistent hyperglycemia.  Some other days trending down blood sugar in the high normal range.  No hypoglycemia.    Hypoglycemia: Patient has no hypoglycemic episodes. Patient has hypoglycemia awareness.  Factors modifying glucose control: 1.  Diabetic diet assessment: 3 meals a day.  She reports she does not eat much bread and no sodas however she drinks fruit juices.  Not paying much attention to diabetic diet.  2.  Staying active or exercising:   3.  Medication compliance: compliant all of the time.  Interval history  Hemoglobin A1c worsened to 8.6%.  CGM data as reviewed above he still with significant hyperglycemia.  Patient had GI intolerance with Ozempic  0.5 mg weekly and has not been taking Ozempic  for last several weeks.  She reports compliance with basal insulin  and metformin .  No numbness and tingling to feet.  No vision problem.  No other  complaints today.  She reports she has planned to see dietitian in the near future.  REVIEW OF SYSTEMS As per history of present illness.   PAST MEDICAL HISTORY: Past Medical History:  Diagnosis Date   HIV (human immunodeficiency virus infection) (HCC)    Obesity     PAST SURGICAL HISTORY: Past Surgical History:  Procedure Laterality Date   ABDOMINAL HYSTERECTOMY  2014   TOOTH EXTRACTION      ALLERGIES: Allergies  Allergen Reactions   Vancomycin Hives, Anaphylaxis, Other (See Comments) and Rash   Ciprofloxacin Hcl Other (See Comments), Hives and Rash    Sulfa Antibiotics Rash   Bactrim Hives   Prochlorperazine     A bad reaction    Ciprofloxacin Hcl Rash   Sulfamethoxazole-Trimethoprim Hives, Other (See Comments) and Rash    FAMILY HISTORY:  History reviewed. No pertinent family history.  SOCIAL HISTORY: Social History   Socioeconomic History   Marital status: Married    Spouse name: Not on file   Number of children: Not on file   Years of education: Not on file   Highest education level: Not on file  Occupational History   Not on file  Tobacco Use   Smoking status: Never   Smokeless tobacco: Never  Vaping Use   Vaping status: Never Used  Substance and Sexual Activity   Alcohol use: No   Drug use: No   Sexual activity: Yes    Birth control/protection: Surgical  Other Topics Concern   Not on file  Social History Narrative   Not on file   Social Drivers of Health   Tobacco Use: Low Risk (02/13/2024)   Patient History    Smoking Tobacco Use: Never    Smokeless Tobacco Use: Never    Passive Exposure: Not on file  Financial Resource Strain: Not on file  Food Insecurity: Low Risk (01/03/2024)   Received from [facility]   Epic    Within the past 12 months, you worried that your food would run out before you got money to buy more: Never true    Within the past 12 months, the food you bought just didn't last and you didn't have money to get more. : Never true  Transportation Needs: No Transportation Needs (01/03/2024)   Received from [facility]    In the past 12 months, has lack of reliable transportation kept you from medical appointments, meetings, work or from getting things needed for daily living? : No  Physical Activity: Not on file  Stress: Not on file  Social Connections: Unknown (06/08/2021)   Received from [facility]   Social Network    Social Network: Not on file  Depression (PHQ2-9): Low Risk (04/21/2023)   Depression (PHQ2-9)    PHQ-2 Score: 0  Alcohol Screen: Not on file   Housing: Low Risk (01/03/2024)   Received from [facility]   Epic    What is your living situation today?: I have a steady place to live    Think about the place you live. Do you have problems with any of the following? Choose all that apply:: None/None on this list  Utilities: Low Risk (01/03/2024)   Received from [facility]   Utilities    In the past 12 months has the electric, gas, oil, or water company threatened to shut off services in your home? : No  Health Literacy: Not on file    MEDICATIONS:  Current Outpatient Medications  Medication Sig Dispense Refill  atorvastatin  (LIPITOR) 10 MG tablet Take 1 tablet (10 mg total) by mouth daily. 90 tablet 3   BD PEN NEEDLE NANO 2ND GEN 32G X 4 MM MISC Use w/ daily insulin  injection 100 each 2   Blood Glucose Monitoring Suppl (FREESTYLE LITE) w/Device KIT Use to check sugar 3 times a day.  Dx code: E11.65 1 kit 0   cabotegravir & rilpivirine ER (CABENUVA) 600 & 900 MG/3ML injection Inject into the muscle.     glucose blood (FREESTYLE LITE) test strip Use to check sugar 3 times a day.  Dx code: E11.65 300 each 1   Lancets (FREESTYLE) lancets Use to check sugar 3 times a day.  Dx code: E11.65 300 each 1   lisinopril  (ZESTRIL ) 10 MG tablet Take 1 tablet (10 mg total) by mouth daily. 90 tablet 3   valACYclovir  (VALTREX ) 1000 MG tablet Take 1 tablet (1,000 mg total) by mouth daily. 90 tablet 1   Continuous Glucose Sensor (DEXCOM G7 SENSOR) MISC Use to continuously monitor blood sugar, replace every 10 days 9 each 3   insulin  glargine, 1 Unit Dial , (TOUJEO ) 300 UNIT/ML Solostar Pen Inject 55 Units into the skin daily. 45 mL 3   metFORMIN  (GLUCOPHAGE -XR) 500 MG 24 hr tablet Take 2 tablets (1,000 mg total) by mouth daily with breakfast. 180 tablet 3   Semaglutide ,0.25 or 0.5MG /DOS, (OZEMPIC , 0.25 OR 0.5 MG/DOSE,) 2 MG/3ML SOPN Inject 0.25 mg into the skin once a week. 9 mL 4   No current facility-administered medications for this visit.     PHYSICAL EXAM: Vitals:   02/13/24 1006  BP: 116/80  Pulse: (!) 110  Resp: 16  SpO2: 97%  Weight: 235 lb 9.6 oz (106.9 kg)  Height: 5' 4 (1.626 m)    Body mass index is 40.44 kg/m.  Wt Readings from Last 3 Encounters:  02/13/24 235 lb 9.6 oz (106.9 kg)  01/26/24 233 lb (105.7 kg)  12/19/23 232 lb (105.2 kg)    General: Well developed, well nourished female in no apparent distress.  HEENT: AT/[place], no external lesions.  Eyes: Conjunctiva clear and no icterus. Neck: Neck supple  Lungs: Respirations not labored Neurologic: Alert, oriented, normal speech Extremities / Skin: Dry.  Psychiatric: Does not appear depressed or anxious  Diabetic Foot Exam - Simple   No data filed    LABS Reviewed Lab Results  Component Value Date   HGBA1C 8.6 (A) 02/13/2024   HGBA1C 8.1 (A) 11/08/2023   HGBA1C 8.1 (H) 07/22/2023   No results found for: FRUCTOSAMINE Lab Results  Component Value Date   CHOL 139 07/22/2023   HDL 41 (L) 07/22/2023   LDLCALC 80 07/22/2023   TRIG 96 07/22/2023   CHOLHDL 3.4 07/22/2023   Lab Results  Component Value Date   MICRALBCREAT 9 07/22/2023   Lab Results  Component Value Date   CREATININE 0.81 07/22/2023   Lab Results  Component Value Date   GFR 76.33 04/21/2023    ASSESSMENT / PLAN  1. Uncontrolled type 2 diabetes mellitus with hyperglycemia, with long-term current use of insulin  (HCC)   2. Type 2 diabetes mellitus with hyperglycemia, with long-term current use of insulin  (HCC)   3. Type 2 diabetes mellitus with other specified complication, with long-term current use of insulin  (HCC)      Diabetes Mellitus type 2, complicated by no other known complications. - Diabetic status / severity: Uncontrolled.  Lab Results  Component Value Date   HGBA1C 8.6 (A) 02/13/2024    -  Hemoglobin A1c goal : <6.5%  Discussed about type 2 diabetes mellitus and potential chronic diabetic complications including diabetic retinopathy, neuropathy  and nephropathy.  Discussed about importance of controlling blood sugar.  Discussed about most important for type 2 diabetes is controlling and maintaining diabetic diet.  Discussed in detail.  Still with significant hyperglycemia.  She will benefit from GLP-1 receptor agonist even to have weight loss benefit, including diabetes control.  She agreed to restart Ozempic .  If she remained uncontrolled with significant postprandial hyperglycemia we will plan for prandial insulin  in the future.  - Medications: See below.  I) increase Toujeo  from 50 to 55 units daily. II) increase metformin  extended release 500 mg 1 tablet to 2 tablets daily. III) Restart Ozempic  0.25 mg weekly.  Will stay on this dose, had GI intolerance with higher dose.  - Home glucose testing: Continue CGM Dexcom G7 and check as needed.  - Discussed/ Gave Hypoglycemia treatment plan.  # Consult : Refer to diabetic educator/dietitian.  Has follow-up in the near future.  # Annual urine for microalbuminuria/ creatinine ratio, no microalbuminuria currently, continue ACE/ARB /lisinopril . Last  Lab Results  Component Value Date   MICRALBCREAT 9 07/22/2023    # Foot check nightly.  # Annual dilated diabetic eye exams.   - Diet: Make healthy diabetic food choices.  Discussed in detail about diet control, avoiding carbohydrate, bread and fruit juice. - Life style / activity / exercise: Discussed.  2. Blood pressure  -  BP Readings from Last 1 Encounters:  02/13/24 116/80    - Control is in target.  - No change in current plans.  3. Lipid status / Hyperlipidemia - Last  Lab Results  Component Value Date   LDLCALC 80 07/22/2023   - Continue atorvastatin  10 mg daily, managed by PCP.  Diagnoses and all orders for this visit:  Uncontrolled type 2 diabetes mellitus with hyperglycemia, with long-term current use of insulin  (HCC) -     Semaglutide ,0.25 or 0.5MG /DOS, (OZEMPIC , 0.25 OR 0.5 MG/DOSE,) 2 MG/3ML SOPN;  Inject 0.25 mg into the skin once a week.  Type 2 diabetes mellitus with hyperglycemia, with long-term current use of insulin  (HCC) -     POCT glycosylated hemoglobin (Hb A1C) -     insulin  glargine, 1 Unit Dial , (TOUJEO ) 300 UNIT/ML Solostar Pen; Inject 55 Units into the skin daily.  Type 2 diabetes mellitus with other specified complication, with long-term current use of insulin  (HCC) -     metFORMIN  (GLUCOPHAGE -XR) 500 MG 24 hr tablet; Take 2 tablets (1,000 mg total) by mouth daily with breakfast. -     Continuous Glucose Sensor (DEXCOM G7 SENSOR) MISC; Use to continuously monitor blood sugar, replace every 10 days    DISPOSITION Follow up in clinic in 3  months suggested.   All questions answered and patient verbalized understanding of the plan.  Randle Shatzer, MD [facility] Endocrinology [facility] [address] [place], [address] [place], [place] [address] Phone # [phone]  At least part of this note was generated using voice recognition software. Inadvertent word errors may have occurred, which were not recognized during the proofreading process. "

## 2024-02-14 ENCOUNTER — Other Ambulatory Visit: Payer: Self-pay

## 2024-02-20 ENCOUNTER — Other Ambulatory Visit: Payer: Self-pay

## 2024-02-23 ENCOUNTER — Other Ambulatory Visit: Payer: Self-pay

## 2024-02-23 MED ORDER — VALACYCLOVIR HCL 1 G PO TABS
1000.0000 mg | ORAL_TABLET | Freq: Every day | ORAL | 6 refills | Status: AC
  Filled 2024-02-23: qty 30, 30d supply, fill #0

## 2024-03-13 ENCOUNTER — Encounter: Admitting: Physician Assistant

## 2024-03-13 NOTE — Progress Notes (Unsigned)
 "  New Patient Office Visit  Subjective    Patient ID: Carrie Dominguez, female    DOB: 1975/03/09  Age: 49 y.o. MRN: [id number]  CC: No chief complaint on file.   HPI Carrie Dominguez presents to establish care with a new provider  HIV - Followed by Dr. Cleotilde, [facility]. - Maintained on Cabenuva, tolerating well. - Normal T helper count  T2DM - Mounjaro  --> Ozempic  due to SE -   Abnormal pap smear - H/o total vaginal hysterectomy with bilateral salpingectomy  - Most recent annual physical was 09/2023 - Abnormal pap smear, colposcopy was recommended - Referral placed to OB/GYN - Saw Dr. Abigail GYN 12/2023  - ASCUS with positive high risk HPV testing - Biopsy 01/28/2024 was negative for dysplasia   ***  Outpatient Encounter Medications as of 03/13/2024  Medication Sig   atorvastatin  (LIPITOR) 10 MG tablet Take 1 tablet (10 mg total) by mouth daily.   BD PEN NEEDLE NANO 2ND GEN 32G X 4 MM MISC Use w/ daily insulin  injection   Blood Glucose Monitoring Suppl (FREESTYLE LITE) w/Device KIT Use to check sugar 3 times a day.  Dx code: E11.65   cabotegravir & rilpivirine ER (CABENUVA) 600 & 900 MG/3ML injection Inject into the muscle.   Continuous Glucose Sensor (DEXCOM G7 SENSOR) MISC Use to continuously monitor blood sugar, replace every 10 days   glucose blood (FREESTYLE LITE) test strip Use to check sugar 3 times a day.  Dx code: E11.65   insulin  glargine, 1 Unit Dial , (TOUJEO ) 300 UNIT/ML Solostar Pen Inject 55 Units into the skin daily.   Lancets (FREESTYLE) lancets Use to check sugar 3 times a day.  Dx code: E11.65   lisinopril  (ZESTRIL ) 10 MG tablet Take 1 tablet (10 mg total) by mouth daily.   metFORMIN  (GLUCOPHAGE -XR) 500 MG 24 hr tablet Take 2 tablets (1,000 mg total) by mouth daily with breakfast.   Semaglutide ,0.25 or 0.5MG /DOS, (OZEMPIC , 0.25 OR 0.5 MG/DOSE,) 2 MG/3ML SOPN Inject 0.25 mg into the skin once a week.   valACYclovir  (VALTREX ) 1000 MG tablet  Take 1 tablet (1,000 mg total) by mouth daily.   valACYclovir  (VALTREX ) 1000 MG tablet Take 1 tablet (1,000 mg total) by mouth daily.   No facility-administered encounter medications on file as of 03/13/2024.    Past Medical History:  Diagnosis Date   HIV (human immunodeficiency virus infection) (HCC)    Obesity     Past Surgical History:  Procedure Laterality Date   ABDOMINAL HYSTERECTOMY  2014   TOOTH EXTRACTION      No family history on file.  Social History   Socioeconomic History   Marital status: Married    Spouse name: Not on file   Number of children: Not on file   Years of education: Not on file   Highest education level: Not on file  Occupational History   Not on file  Tobacco Use   Smoking status: Never   Smokeless tobacco: Never  Vaping Use   Vaping status: Never Used  Substance and Sexual Activity   Alcohol use: No   Drug use: No   Sexual activity: Yes    Birth control/protection: Surgical  Other Topics Concern   Not on file  Social History Narrative   Not on file   Social Drivers of Health   Tobacco Use: Low Risk (02/13/2024)   Patient History    Smoking Tobacco Use: Never    Smokeless Tobacco Use: Never    Passive  Exposure: Not on file  Financial Resource Strain: Not on file  Food Insecurity: Low Risk (01/03/2024)   Received from [facility]   Epic    Within the past 12 months, you worried that your food would run out before you got money to buy more: Never true    Within the past 12 months, the food you bought just didn't last and you didn't have money to get more. : Never true  Transportation Needs: No Transportation Needs (01/03/2024)   Received from [facility]    In the past 12 months, has lack of reliable transportation kept you from medical appointments, meetings, work or from getting things needed for daily living? : No  Physical Activity: Not on file  Stress: Not on file  Social Connections: Unknown (06/08/2021)    Received from [facility]   Social Network    Social Network: Not on file  Intimate Partner Violence: Unknown (05/16/2021)   Received from [facility]   HITS    Physically Hurt: Not on file    Insult or Talk Down To: Not on file    Threaten Physical Harm: Not on file    Scream or Curse: Not on file  Depression (PHQ2-9): Low Risk (04/21/2023)   Depression (PHQ2-9)    PHQ-2 Score: 0  Alcohol Screen: Not on file  Housing: Low Risk (01/03/2024)   Received from [facility]   Epic    What is your living situation today?: I have a steady place to live    Think about the place you live. Do you have problems with any of the following? Choose all that apply:: None/None on this list  Utilities: Low Risk (01/03/2024)   Received from [facility]   Utilities    In the past 12 months has the electric, gas, oil, or water company threatened to shut off services in your home? : No  Health Literacy: Not on file    ROS      Objective    LMP 11/06/2011   Physical Exam  {Labs (Optional):23779}    Assessment & Plan:   Problem List Items Addressed This Visit   None   No follow-ups on file.   Carrie Seip, PA-C   "

## 2024-03-20 ENCOUNTER — Encounter: Admitting: Physician Assistant

## 2024-05-21 ENCOUNTER — Ambulatory Visit: Admitting: Endocrinology

## 2024-06-22 ENCOUNTER — Encounter: Admitting: Family
